# Patient Record
Sex: Male | Born: 1995 | Race: White | Hispanic: No | Marital: Single | Smoking: Current every day smoker
Health system: Southern US, Community
[De-identification: ages and names within clinical notes are randomized; demographics above are authoritative.]

---
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
## 2017-02-20 ENCOUNTER — Other Ambulatory Visit
Admission: RE | Admit: 2017-02-20 | Discharge: 2017-02-20 | Disposition: A | Discharge status: Home / Self Care | Attending: Family Medicine | Admitting: Family Medicine
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
## 2017-10-25 ENCOUNTER — Encounter: Payer: Self-pay | Admitting: Emergency Medicine

## 2017-10-25 ENCOUNTER — Emergency Department
Admission: EM | Admit: 2017-10-25 | Discharge: 2017-10-25 | Disposition: A | Discharge status: Home / Self Care | Attending: Emergency Medicine | Admitting: Emergency Medicine

## 2017-10-25 ENCOUNTER — Other Ambulatory Visit: Payer: Self-pay

## 2017-10-25 DIAGNOSIS — Y999 Unspecified external cause status: Secondary | ICD-10-CM | POA: Insufficient documentation

## 2017-10-25 DIAGNOSIS — Y929 Unspecified place or not applicable: Secondary | ICD-10-CM | POA: Insufficient documentation

## 2017-10-25 DIAGNOSIS — M546 Pain in thoracic spine: Secondary | ICD-10-CM

## 2017-10-25 DIAGNOSIS — W19XXXA Unspecified fall, initial encounter: Secondary | ICD-10-CM | POA: Insufficient documentation

## 2017-10-25 DIAGNOSIS — S2020XA Contusion of thorax, unspecified, initial encounter: Secondary | ICD-10-CM

## 2017-10-25 DIAGNOSIS — F17210 Nicotine dependence, cigarettes, uncomplicated: Secondary | ICD-10-CM | POA: Insufficient documentation

## 2017-10-25 DIAGNOSIS — S20229A Contusion of unspecified back wall of thorax, initial encounter: Secondary | ICD-10-CM | POA: Insufficient documentation

## 2017-10-25 DIAGNOSIS — Y939 Activity, unspecified: Secondary | ICD-10-CM | POA: Insufficient documentation

## 2017-10-25 NOTE — ED Triage Notes (Signed)
FIRST NURSE NOTE-fell and hit back on futon. Ambulatory. NAD
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
## 2017-10-25 NOTE — Discharge Instructions (Addendum)
Follow-up with your primary care provider if any continued problems.  You may take Tylenol or ibuprofen as needed for pain.
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
## 2017-10-26 ENCOUNTER — Other Ambulatory Visit: Payer: Self-pay

## 2017-10-26 ENCOUNTER — Encounter: Payer: Self-pay | Admitting: Emergency Medicine

## 2017-10-26 DIAGNOSIS — Z5321 Procedure and treatment not carried out due to patient leaving prior to being seen by health care provider: Secondary | ICD-10-CM | POA: Insufficient documentation

## 2017-10-26 DIAGNOSIS — M545 Low back pain: Secondary | ICD-10-CM | POA: Insufficient documentation
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
## 2017-10-27 ENCOUNTER — Other Ambulatory Visit: Payer: Self-pay

## 2017-10-27 ENCOUNTER — Emergency Department
Admission: EM | Admit: 2017-10-27 | Discharge: 2017-10-27 | Disposition: A | Discharge status: Home / Self Care | Attending: Emergency Medicine | Admitting: Emergency Medicine

## 2017-10-27 DIAGNOSIS — M545 Low back pain, unspecified: Secondary | ICD-10-CM

## 2017-10-27 DIAGNOSIS — F17210 Nicotine dependence, cigarettes, uncomplicated: Secondary | ICD-10-CM | POA: Insufficient documentation

## 2017-10-27 DIAGNOSIS — M549 Dorsalgia, unspecified: Secondary | ICD-10-CM | POA: Insufficient documentation

## 2017-10-27 MED ORDER — KETOROLAC TROMETHAMINE 10 MG PO TABS: 10.0000 mg | ORAL_TABLET | Freq: Four times a day (QID) | ORAL | 0 refills | Status: DC | PRN

## 2017-10-27 MED ORDER — LIDOCAINE 5 % EX PTCH
1.0000 | MEDICATED_PATCH | CUTANEOUS | Status: DC
  Administered 2017-10-27: 1 via TRANSDERMAL
  Filled 2017-10-27: qty 1

## 2017-10-27 MED ORDER — KETOROLAC TROMETHAMINE 30 MG/ML IJ SOLN
30.0000 mg | Freq: Once | INTRAMUSCULAR | Status: AC
  Administered 2017-10-27: 30 mg via INTRAMUSCULAR
  Filled 2017-10-27: qty 1

## 2017-10-27 MED ORDER — CYCLOBENZAPRINE HCL 5 MG PO TABS: ORAL_TABLET | ORAL | 0 refills | Status: DC

## 2017-10-27 MED ORDER — LIDOCAINE 5 % EX PTCH: 1.0000 | MEDICATED_PATCH | CUTANEOUS | 0 refills | Status: DC

## 2017-10-27 NOTE — ED Notes (Signed)
Pt has lower back pain.  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  Pt states he took a muscle relaxer today without relief.

## 2017-10-27 NOTE — ED Triage Notes (Signed)
Pt arrives to ED via POV from home with c/o back pain. Pt reports being seen here for same "a couple times" for same and reports not better. Pt is A&O, in NAD; RR even, regular and unlabored. Pt is ambulatory with difficulty.

## 2017-10-27 NOTE — ED Provider Notes (Signed)
[facility] Emergency Department Provider Note  ____________________________________________  Time seen: Approximately 8:09 PM  I have reviewed the triage vital signs and the nursing notes.   HISTORY  Chief Complaint Back Pain    HPI Rodney Morgan. is a 22 y.o. male that presents to the emergency department for evaluation of back pain for 4 days. On Saturday, he fell between the cushions of the futon. Pain is constant. Pain eased off somewhat but then worsened. He took a goody powder and muscle relaxer a couple of hours ago. He does not think that anything is broken. He is concerned that pain worsens at work. No SOB, CP, nausea, vomiting.   History reviewed. No pertinent past medical history.  Patient Active Problem List   Diagnosis Date Noted  . Metacarpal bone fracture 01/23/2014    Past Surgical History:  Procedure Laterality Date  . OPEN REDUCTION INTERNAL FIXATION (ORIF) METACARPAL Right 01/25/2014   Procedure: OPEN REDUCTION INTERNAL FIXATION (ORIF) FIFTH METACARPAL;  Surgeon: Vickki Hearing, MD;  Location: [facility];  Service: Orthopedics;  Laterality: Right;  right fifth metacarpal    Prior to Admission medications   Medication Sig Start Date End Date Taking? Authorizing Provider  cyclobenzaprine (FLEXERIL) 5 MG tablet Take 1-2 tablets 3 times daily as needed 10/27/17   Enid Derry, PA-C  ketorolac (TORADOL) 10 MG tablet Take 1 tablet (10 mg total) by mouth every 6 (six) hours as needed. 10/27/17   Enid Derry, PA-C  lidocaine (LIDODERM) 5 % Place 1 patch onto the skin daily. Remove & Discard patch within 12 hours or as directed by MD 10/27/17   Enid Derry, PA-C    Allergies Patient has no known allergies.  No family history on file.  Social History Social History   Tobacco Use  . Smoking status: Current Every Day Smoker    Packs/day: 1.00    Types: Cigarettes  . Smokeless tobacco: Never Used  Substance Use Topics  .  Alcohol use: No  . Drug use: Yes    Types: Marijuana    Comment: last use x1 month ago.     Review of Systems  Cardiovascular: No chest pain. Respiratory: No SOB. Gastrointestinal: No abdominal pain.  No nausea, no vomiting.  Musculoskeletal: Negative for neck pain. Positive for back pain.  Skin: Negative for rash, abrasions, lacerations, ecchymosis. Neurological: Negative for headaches, numbness or tingling   ____________________________________________   PHYSICAL EXAM:  VITAL SIGNS: ED Triage Vitals  Enc Vitals Group     BP 10/27/17 1915 116/75     Pulse Rate 10/27/17 1915 87     Resp 10/27/17 1915 17     Temp 10/27/17 1915 98.5 F (36.9 C)     Temp Source 10/27/17 1915 Oral     SpO2 10/27/17 1915 100 %     Weight 10/27/17 1914 150 lb (68 kg)     Height 10/27/17 1914  (1.727 m)     Head Circumference --      Peak Flow --      Pain Score 10/27/17 1913 8     Pain Loc --      Pain Edu? --      Excl. in GC? --      Constitutional: Alert and oriented. Well appearing and in no acute distress. Eyes: Conjunctivae are normal. PERRL. EOMI. Head: Atraumatic. ENT:      Ears:      Nose: No congestion/rhinnorhea.      Mouth/Throat: Mucous  membranes are moist.  Neck: No stridor.   Cardiovascular: Normal rate, regular rhythm.  Good peripheral circulation. Respiratory: Normal respiratory effort without tachypnea or retractions. Lungs CTAB. Good air entry to the bases with no decreased or absent breath sounds. Gastrointestinal: Bowel sounds 4 quadrants. Soft and nontender to palpation. No guarding or rigidity. No palpable masses. No distention. Musculoskeletal: Full range of motion to all extremities. No gross deformities appreciated. Tenderness to palpation over inferior thoracic spine. Full ROM of back. Strength equal in lower extremities bilaterally. Normal gait. Neurologic:  Normal speech and language. No gross focal neurologic deficits are appreciated.  Skin:  Skin  is warm, dry and intact. No rash noted.   ____________________________________________   LABS (all labs ordered are listed, but only abnormal results are displayed)  Labs Reviewed - No data to display ____________________________________________  EKG   ____________________________________________  RADIOLOGY   No results found.  ____________________________________________    PROCEDURES  Procedure(s) performed:    Procedures    Medications  lidocaine (LIDODERM) 5 % 1 patch (1 patch Transdermal Patch Applied 10/27/17 2139)  ketorolac (TORADOL) 30 MG/ML injection 30 mg (30 mg Intramuscular Given 10/27/17 2139)     ____________________________________________   INITIAL IMPRESSION / ASSESSMENT AND PLAN / ED COURSE  Pertinent labs & imaging results that were available during my care of the patient were reviewed by me and considered in my medical decision making (see chart for details).  Review of the [place] CSRS was performed in accordance of the [facility] prior to dispensing any controlled drugs.     Patient presented to the emergency department for evaluation of back pain after injury for four days. Patient declines imaging. IM toradol was given. Patient will be discharged home with prescriptions for toradol, flexeril, lidoderm. Patient is to follow up with PCP as directed. Patient is given ED precautions to return to the ED for any worsening or new symptoms.     ____________________________________________  FINAL CLINICAL IMPRESSION(S) / ED DIAGNOSES  Final diagnoses:  Acute midline low back pain without sciatica      NEW MEDICATIONS STARTED DURING THIS VISIT:  ED Discharge Orders        Ordered    ketorolac (TORADOL) 10 MG tablet  Every 6 hours PRN     10/27/17 2118    cyclobenzaprine (FLEXERIL) 5 MG tablet     10/27/17 2118    lidocaine (LIDODERM) 5 %  Every 24 hours     10/27/17 2118          This chart was dictated using voice recognition  software/Dragon. Despite best efforts to proofread, errors can occur which can change the meaning. Any change was purely unintentional.    Enid Derry, PA-C 10/27/17 2358    Loleta Rose, MD 10/28/17 [phone]

## 2018-03-20 ENCOUNTER — Emergency Department: Payer: Self-pay

## 2018-03-20 ENCOUNTER — Other Ambulatory Visit: Payer: Self-pay

## 2018-03-20 ENCOUNTER — Emergency Department
Admission: EM | Admit: 2018-03-20 | Discharge: 2018-03-20 | Disposition: A | Discharge status: Home / Self Care | Payer: Self-pay | Attending: Emergency Medicine | Admitting: Emergency Medicine

## 2018-03-20 DIAGNOSIS — Z79899 Other long term (current) drug therapy: Secondary | ICD-10-CM | POA: Insufficient documentation

## 2018-03-20 DIAGNOSIS — F17210 Nicotine dependence, cigarettes, uncomplicated: Secondary | ICD-10-CM | POA: Insufficient documentation

## 2018-03-20 DIAGNOSIS — R0789 Other chest pain: Secondary | ICD-10-CM | POA: Insufficient documentation

## 2018-03-20 LAB — TROPONIN I: Troponin I: <0.03 ng/mL (ref <0.03)

## 2018-03-20 LAB — BASIC METABOLIC PANEL
Anion gap: 8 (ref 5–15)
BUN: 13 mg/dL (ref 6–20)
CHLORIDE: 105 mmol/L (ref 98–111)
CO2: 26 mmol/L (ref 22–32)
Calcium: 9.1 mg/dL (ref 8.9–10.3)
Creatinine, Ser: 0.92 mg/dL (ref 0.61–1.24)
GFR calc Af Amer: >60 mL/min (ref >60)
GFR calc non Af Amer: >60 mL/min (ref >60)
Glucose, Bld: 84 mg/dL (ref 70–99)
POTASSIUM: 3.8 mmol/L (ref 3.5–5.1)
Sodium: 139 mmol/L (ref 135–145)

## 2018-03-20 LAB — CBC
HEMATOCRIT: 44.6 % (ref 39.0–52.0)
Hemoglobin: 14.3 g/dL (ref 13.0–17.0)
MCH: 29.9 pg (ref 26.0–34.0)
MCHC: 32.1 g/dL (ref 30.0–36.0)
MCV: 93.1 fL (ref 80.0–100.0)
Platelets: 205 10*3/uL (ref 150–400)
RBC: 4.79 MIL/uL (ref 4.22–5.81)
RDW: 12.9 % (ref 11.5–15.5)
WBC: 10.4 10*3/uL (ref 4.0–10.5)
nRBC: 0 % (ref 0.0–0.2)

## 2018-03-20 LAB — FIBRIN DERIVATIVES D-DIMER (ARMC ONLY): FIBRIN DERIVATIVES D-DIMER (ARMC): 127.17 ng{FEU}/mL (ref 0.00–499.00)

## 2018-03-20 MED ORDER — NAPROXEN 500 MG PO TABS: 500.0000 mg | ORAL_TABLET | Freq: Two times a day (BID) | ORAL | 2 refills | Status: DC

## 2018-03-20 NOTE — ED Provider Notes (Signed)
[facility] Emergency Department Provider Note   ____________________________________________    I have reviewed the triage vital signs and the nursing notes.   HISTORY  Chief Complaint Shortness of Breath     HPI Rodney Morgan. is a 22 y.o. male who presents with complaints of bilateral chest pain for many months.  Patient states that at work when he takes a deep breath he often feels pain in his lateral chest wall bilaterally.  Currently feels well and has no discomfort.  Denies central chest pain.  No fevers or chills.  No recent travel.  No calf pain or swelling.  Also complains of occasional sore throat.  This is been ongoing for nearly a year.  History reviewed. No pertinent past medical history.  Patient Active Problem List   Diagnosis Date Noted  . Metacarpal bone fracture 01/23/2014    Past Surgical History:  Procedure Laterality Date  . OPEN REDUCTION INTERNAL FIXATION (ORIF) METACARPAL Right 01/25/2014   Procedure: OPEN REDUCTION INTERNAL FIXATION (ORIF) FIFTH METACARPAL;  Surgeon: Vickki Hearing, MD;  Location: [facility];  Service: Orthopedics;  Laterality: Right;  right fifth metacarpal    Prior to Admission medications   Medication Sig Start Date End Date Taking? Authorizing Provider  cyclobenzaprine (FLEXERIL) 5 MG tablet Take 1-2 tablets 3 times daily as needed 10/27/17   Enid Derry, PA-C  ketorolac (TORADOL) 10 MG tablet Take 1 tablet (10 mg total) by mouth every 6 (six) hours as needed. 10/27/17   Enid Derry, PA-C  lidocaine (LIDODERM) 5 % Place 1 patch onto the skin daily. Remove & Discard patch within 12 hours or as directed by MD 10/27/17   Enid Derry, PA-C  naproxen (NAPROSYN) 500 MG tablet Take 1 tablet (500 mg total) by mouth 2 (two) times daily with a meal. 03/20/18   Jene Every, MD     Allergies Patient has no known allergies.  History reviewed. No pertinent family history.  Social History Social  History   Tobacco Use  . Smoking status: Current Every Day Smoker    Packs/day: 1.00    Types: Cigarettes  . Smokeless tobacco: Never Used  Substance Use Topics  . Alcohol use: No  . Drug use: Yes    Types: Marijuana    Comment: last use x1 month ago.    Review of Systems  Constitutional: No fever/chills Eyes: No visual changes.  ENT: As above Cardiovascular: As above Respiratory: No difficulty breathing Gastrointestinal: No abdominal pain.   Genitourinary: Negative for dysuria. Musculoskeletal: Negative for back pain. Skin: Negative for rash. Neurological: Negative for headaches   ____________________________________________   PHYSICAL EXAM:  VITAL SIGNS: ED Triage Vitals  Enc Vitals Group     BP 03/20/18 1526 123/64     Pulse Rate 03/20/18 1526 78     Resp 03/20/18 1526 16     Temp 03/20/18 1526 97.9 F (36.6 C)     Temp Source 03/20/18 1526 Oral     SpO2 03/20/18 1526 100 %     Weight 03/20/18 1525 65.8 kg (145 lb)     Height 03/20/18 1525 1.727 m (5\' 8" )     Head Circumference --      Peak Flow --      Pain Score 03/20/18 1525 7     Pain Loc --      Pain Edu? --      Excl. in GC? --     Constitutional: Alert and oriented.  Eyes: Conjunctivae are normal.   Nose: No congestion/rhinnorhea.  Cardiovascular: Normal rate, regular rhythm. Grossly normal heart sounds.  Good peripheral circulation.  No chest wall tenderness to palpation Respiratory: Normal respiratory effort.  No retractions. Lungs CTAB. Gastrointestinal: Soft and nontender. No distention.  No CVA tenderness.  Musculoskeletal: No lower extremity tenderness nor edema.  Warm and well perfused Neurologic:  Normal speech and language. No gross focal neurologic deficits are appreciated.  Skin:  Skin is warm, dry and intact. No rash noted. Psychiatric: Mood and affect are normal. Speech and behavior are normal.  ____________________________________________   LABS (all labs ordered are  listed, but only abnormal results are displayed)  Labs Reviewed  BASIC METABOLIC PANEL  CBC  TROPONIN I  FIBRIN DERIVATIVES D-DIMER ([facility] ONLY)   ____________________________________________  EKG  ED ECG REPORT I, Jene Every, the attending physician, personally viewed and interpreted this ECG.  Date: 03/20/2018  Rhythm: normal sinus rhythm QRS Axis: normal Intervals: normal ST/T Wave abnormalities: normal Narrative Interpretation: no evidence of acute ischemia  ____________________________________________  RADIOLOGY  Chest x-ray normal ____________________________________________   PROCEDURES  Procedure(s) performed: No  Procedures   Critical Care performed: No ____________________________________________   INITIAL IMPRESSION / ASSESSMENT AND PLAN / ED COURSE  Pertinent labs & imaging results that were available during my care of the patient were reviewed by me and considered in my medical decision making (see chart for details).  Patient well-appearing in no acute distress.  Exam is overall reassuring, lab work is benign, EKG is normal.  Chest x-ray is clear.  Given pleuritic nature of chest pain will send d-dimer although I find the patient to be extraordinarily low risk for PE.  Possibly muscular skeletal pain.  D-dimer negative.  Will treat with Naprosyn, have the patient follow-up with PCP,    ____________________________________________   FINAL CLINICAL IMPRESSION(S) / ED DIAGNOSES  Final diagnoses:  Chest wall pain        Note:  This document was prepared using Dragon voice recognition software and may include unintentional dictation errors.    Jene Every, MD 03/20/18 [phone]

## 2018-03-20 NOTE — ED Triage Notes (Signed)
Pt states "it hurts to breath" x 1 month. States "when I'm at work I get sharp pains in my chest" x 2 weeks. Also c/o throat pain.   A&Ox4, ambulatory, no distress noted.

## 2018-03-28 ENCOUNTER — Emergency Department: Payer: Self-pay

## 2018-03-28 ENCOUNTER — Emergency Department
Admission: EM | Admit: 2018-03-28 | Discharge: 2018-03-28 | Disposition: A | Discharge status: Home / Self Care | Payer: Self-pay | Attending: Emergency Medicine | Admitting: Emergency Medicine

## 2018-03-28 ENCOUNTER — Other Ambulatory Visit: Payer: Self-pay

## 2018-03-28 DIAGNOSIS — S3991XA Unspecified injury of abdomen, initial encounter: Secondary | ICD-10-CM

## 2018-03-28 DIAGNOSIS — F17210 Nicotine dependence, cigarettes, uncomplicated: Secondary | ICD-10-CM | POA: Insufficient documentation

## 2018-03-28 DIAGNOSIS — Z79899 Other long term (current) drug therapy: Secondary | ICD-10-CM | POA: Insufficient documentation

## 2018-03-28 DIAGNOSIS — Y990 Civilian activity done for income or pay: Secondary | ICD-10-CM | POA: Insufficient documentation

## 2018-03-28 DIAGNOSIS — S298XXA Other specified injuries of thorax, initial encounter: Secondary | ICD-10-CM

## 2018-03-28 DIAGNOSIS — S20211A Contusion of right front wall of thorax, initial encounter: Secondary | ICD-10-CM | POA: Insufficient documentation

## 2018-03-28 DIAGNOSIS — Y929 Unspecified place or not applicable: Secondary | ICD-10-CM | POA: Insufficient documentation

## 2018-03-28 DIAGNOSIS — Y9389 Activity, other specified: Secondary | ICD-10-CM | POA: Insufficient documentation

## 2018-03-28 DIAGNOSIS — S301XXA Contusion of abdominal wall, initial encounter: Secondary | ICD-10-CM | POA: Insufficient documentation

## 2018-03-28 DIAGNOSIS — W228XXA Striking against or struck by other objects, initial encounter: Secondary | ICD-10-CM | POA: Insufficient documentation

## 2018-03-28 LAB — CBC WITH DIFFERENTIAL/PLATELET
ABS IMMATURE GRANULOCYTES: 0.05 10*3/uL (ref 0.00–0.07)
Basophils Absolute: 0 10*3/uL (ref 0.0–0.1)
Basophils Relative: 0 %
Eosinophils Absolute: 0.3 10*3/uL (ref 0.0–0.5)
Eosinophils Relative: 2 %
HEMATOCRIT: 44.2 % (ref 39.0–52.0)
Hemoglobin: 14.3 g/dL (ref 13.0–17.0)
Immature Granulocytes: 0 %
LYMPHS ABS: 1.9 10*3/uL (ref 0.7–4.0)
Lymphocytes Relative: 16 %
MCH: 30 pg (ref 26.0–34.0)
MCHC: 32.4 g/dL (ref 30.0–36.0)
MCV: 92.9 fL (ref 80.0–100.0)
MONOS PCT: 8 %
Monocytes Absolute: 1 10*3/uL (ref 0.1–1.0)
NEUTROS ABS: 8.6 10*3/uL — AB (ref 1.7–7.7)
NEUTROS PCT: 74 %
PLATELETS: 221 10*3/uL (ref 150–400)
RBC: 4.76 MIL/uL (ref 4.22–5.81)
RDW: 12.7 % (ref 11.5–15.5)
WBC: 11.9 10*3/uL — ABNORMAL HIGH (ref 4.0–10.5)
nRBC: 0 % (ref 0.0–0.2)

## 2018-03-28 LAB — COMPREHENSIVE METABOLIC PANEL
ALBUMIN: 4.7 g/dL (ref 3.5–5.0)
ALT: 19 U/L (ref 0–44)
AST: 26 U/L (ref 15–41)
Alkaline Phosphatase: 63 U/L (ref 38–126)
Anion gap: 10 (ref 5–15)
BUN: 14 mg/dL (ref 6–20)
CHLORIDE: 105 mmol/L (ref 98–111)
CO2: 26 mmol/L (ref 22–32)
Calcium: 9.4 mg/dL (ref 8.9–10.3)
Creatinine, Ser: 0.84 mg/dL (ref 0.61–1.24)
GFR calc Af Amer: >60 mL/min (ref >60)
GFR calc non Af Amer: >60 mL/min (ref >60)
Glucose, Bld: 82 mg/dL (ref 70–99)
POTASSIUM: 3.5 mmol/L (ref 3.5–5.1)
SODIUM: 141 mmol/L (ref 135–145)
Total Bilirubin: 0.6 mg/dL (ref 0.3–1.2)
Total Protein: 8 g/dL (ref 6.5–8.1)

## 2018-03-28 MED ORDER — TRAMADOL HCL 50 MG PO TABS: 50.0000 mg | ORAL_TABLET | Freq: Four times a day (QID) | ORAL | 0 refills | Status: DC | PRN

## 2018-03-28 MED ORDER — IOPAMIDOL (ISOVUE-300) INJECTION 61%
100.0000 mL | Freq: Once | INTRAVENOUS | Status: AC | PRN
  Administered 2018-03-28: 100 mL via INTRAVENOUS
  Filled 2018-03-28: qty 100

## 2018-03-28 MED ORDER — MELOXICAM 15 MG PO TABS: 15.0000 mg | ORAL_TABLET | Freq: Every day | ORAL | 0 refills | Status: DC

## 2018-03-28 MED ORDER — SODIUM CHLORIDE 0.9 % IV BOLUS
1000.0000 mL | Freq: Once | INTRAVENOUS | Status: AC
  Administered 2018-03-28: 1000 mL via INTRAVENOUS

## 2018-03-28 NOTE — ED Notes (Signed)
See triage note   States he had a tire blow  Hit in rib area
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
## 2018-03-28 NOTE — ED Notes (Signed)
Rodney Morgan says we need uds

## 2018-03-28 NOTE — ED Notes (Signed)
Employer left to go back to work.  Gave patient number [phone] to call if he needs a ride home.
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
## 2018-05-26 ENCOUNTER — Other Ambulatory Visit: Payer: Self-pay

## 2018-05-26 DIAGNOSIS — R1030 Lower abdominal pain, unspecified: Secondary | ICD-10-CM | POA: Insufficient documentation

## 2018-05-26 DIAGNOSIS — R110 Nausea: Secondary | ICD-10-CM | POA: Insufficient documentation

## 2018-05-26 DIAGNOSIS — Z5321 Procedure and treatment not carried out due to patient leaving prior to being seen by health care provider: Secondary | ICD-10-CM | POA: Insufficient documentation

## 2018-05-26 LAB — COMPREHENSIVE METABOLIC PANEL
ALBUMIN: 4.3 g/dL (ref 3.5–5.0)
ALK PHOS: 55 U/L (ref 38–126)
ALT: 19 U/L (ref 0–44)
AST: 31 U/L (ref 15–41)
Anion gap: 10 (ref 5–15)
BUN: 18 mg/dL (ref 6–20)
CALCIUM: 9.4 mg/dL (ref 8.9–10.3)
CO2: 24 mmol/L (ref 22–32)
CREATININE: 1.05 mg/dL (ref 0.61–1.24)
Chloride: 106 mmol/L (ref 98–111)
GFR calc Af Amer: >60 mL/min (ref >60)
GFR calc non Af Amer: >60 mL/min (ref >60)
GLUCOSE: 147 mg/dL — AB (ref 70–99)
Potassium: 3.6 mmol/L (ref 3.5–5.1)
SODIUM: 140 mmol/L (ref 135–145)
Total Bilirubin: 0.6 mg/dL (ref 0.3–1.2)
Total Protein: 7.2 g/dL (ref 6.5–8.1)

## 2018-05-26 LAB — URINALYSIS, COMPLETE (UACMP) WITH MICROSCOPIC
BILIRUBIN URINE: NEGATIVE
Bacteria, UA: NONE SEEN
Glucose, UA: NEGATIVE mg/dL
Ketones, ur: 5 mg/dL — AB
Leukocytes, UA: NEGATIVE
Nitrite: NEGATIVE
PROTEIN: NEGATIVE mg/dL
SPECIFIC GRAVITY, URINE: 1.026 (ref 1.005–1.030)
pH: 5 (ref 5.0–8.0)

## 2018-05-26 LAB — CBC
HCT: 38.9 % — ABNORMAL LOW (ref 39.0–52.0)
Hemoglobin: 13 g/dL (ref 13.0–17.0)
MCH: 29.5 pg (ref 26.0–34.0)
MCHC: 33.4 g/dL (ref 30.0–36.0)
MCV: 88.4 fL (ref 80.0–100.0)
PLATELETS: 213 10*3/uL (ref 150–400)
RBC: 4.4 MIL/uL (ref 4.22–5.81)
RDW: 12.8 % (ref 11.5–15.5)
WBC: 10 10*3/uL (ref 4.0–10.5)
nRBC: 0 % (ref 0.0–0.2)

## 2018-05-26 LAB — LIPASE, BLOOD: Lipase: 28 U/L (ref 11–51)
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
## 2018-05-27 ENCOUNTER — Emergency Department
Admission: EM | Admit: 2018-05-27 | Discharge: 2018-05-27 | Discharge status: Against Medical Advice | Payer: Self-pay | Attending: Emergency Medicine | Admitting: Emergency Medicine

## 2018-05-27 NOTE — ED Notes (Signed)
No answer when called several times from lobby 

## 2018-05-28 ENCOUNTER — Other Ambulatory Visit: Payer: Self-pay

## 2018-05-28 ENCOUNTER — Emergency Department: Payer: Self-pay

## 2018-05-28 ENCOUNTER — Emergency Department
Admission: EM | Admit: 2018-05-28 | Discharge: 2018-05-28 | Disposition: A | Discharge status: Home / Self Care | Payer: Self-pay | Attending: Emergency Medicine | Admitting: Emergency Medicine

## 2018-05-28 ENCOUNTER — Encounter: Payer: Self-pay | Admitting: Radiology

## 2018-05-28 DIAGNOSIS — Z79899 Other long term (current) drug therapy: Secondary | ICD-10-CM | POA: Insufficient documentation

## 2018-05-28 DIAGNOSIS — F17210 Nicotine dependence, cigarettes, uncomplicated: Secondary | ICD-10-CM | POA: Insufficient documentation

## 2018-05-28 DIAGNOSIS — N200 Calculus of kidney: Secondary | ICD-10-CM | POA: Insufficient documentation

## 2018-05-28 LAB — LIPASE, BLOOD: Lipase: 22 U/L (ref 11–51)

## 2018-05-28 LAB — URINALYSIS, COMPLETE (UACMP) WITH MICROSCOPIC
Bacteria, UA: NONE SEEN
Bilirubin Urine: NEGATIVE
Glucose, UA: NEGATIVE mg/dL
KETONES UR: 80 mg/dL — AB
Nitrite: NEGATIVE
PH: 6 (ref 5.0–8.0)
PROTEIN: 100 mg/dL — AB
Specific Gravity, Urine: 1.028 (ref 1.005–1.030)

## 2018-05-28 LAB — COMPREHENSIVE METABOLIC PANEL
ALBUMIN: 5 g/dL (ref 3.5–5.0)
ALK PHOS: 60 U/L (ref 38–126)
ALT: 22 U/L (ref 0–44)
ANION GAP: 11 (ref 5–15)
AST: 33 U/L (ref 15–41)
BUN: 17 mg/dL (ref 6–20)
CALCIUM: 9.5 mg/dL (ref 8.9–10.3)
CO2: 22 mmol/L (ref 22–32)
CREATININE: 1.01 mg/dL (ref 0.61–1.24)
Chloride: 105 mmol/L (ref 98–111)
GFR calc Af Amer: >60 mL/min (ref >60)
GFR calc non Af Amer: >60 mL/min (ref >60)
GLUCOSE: 102 mg/dL — AB (ref 70–99)
Potassium: 3.4 mmol/L — ABNORMAL LOW (ref 3.5–5.1)
SODIUM: 138 mmol/L (ref 135–145)
Total Bilirubin: 1.1 mg/dL (ref 0.3–1.2)
Total Protein: 7.9 g/dL (ref 6.5–8.1)

## 2018-05-28 LAB — CBC
HCT: 42.7 % (ref 39.0–52.0)
Hemoglobin: 14.4 g/dL (ref 13.0–17.0)
MCH: 29.7 pg (ref 26.0–34.0)
MCHC: 33.7 g/dL (ref 30.0–36.0)
MCV: 88 fL (ref 80.0–100.0)
PLATELETS: 226 10*3/uL (ref 150–400)
RBC: 4.85 MIL/uL (ref 4.22–5.81)
RDW: 12.9 % (ref 11.5–15.5)
WBC: 14.7 10*3/uL — ABNORMAL HIGH (ref 4.0–10.5)
nRBC: 0 % (ref 0.0–0.2)

## 2018-05-28 MED ORDER — IBUPROFEN 600 MG PO TABS: 600.0000 mg | ORAL_TABLET | Freq: Four times a day (QID) | ORAL | 0 refills | Status: DC | PRN

## 2018-05-28 MED ORDER — ONDANSETRON 4 MG PO TBDP: 4.0000 mg | ORAL_TABLET | Freq: Three times a day (TID) | ORAL | 0 refills | Status: DC | PRN

## 2018-05-28 MED ORDER — IOPAMIDOL (ISOVUE-300) INJECTION 61%
100.0000 mL | Freq: Once | INTRAVENOUS | Status: AC | PRN
  Administered 2018-05-28: 100 mL via INTRAVENOUS

## 2018-05-28 MED ORDER — SODIUM CHLORIDE 0.9 % IV BOLUS
1000.0000 mL | Freq: Once | INTRAVENOUS | Status: AC
  Administered 2018-05-28: 1000 mL via INTRAVENOUS

## 2018-05-28 MED ORDER — ONDANSETRON HCL 4 MG/2ML IJ SOLN
4.0000 mg | Freq: Once | INTRAMUSCULAR | Status: AC
  Administered 2018-05-28: 4 mg via INTRAVENOUS
  Filled 2018-05-28: qty 2

## 2018-05-28 MED ORDER — FENTANYL CITRATE (PF) 100 MCG/2ML IJ SOLN
25.0000 ug | Freq: Once | INTRAMUSCULAR | Status: AC
  Administered 2018-05-28: 25 ug via INTRAVENOUS
  Filled 2018-05-28: qty 2

## 2018-05-28 MED ORDER — TAMSULOSIN HCL 0.4 MG PO CAPS: 0.4000 mg | ORAL_CAPSULE | Freq: Every day | ORAL | 0 refills | Status: AC

## 2018-05-28 MED ORDER — TAMSULOSIN HCL 0.4 MG PO CAPS
0.4000 mg | ORAL_CAPSULE | Freq: Once | ORAL | Status: AC
  Administered 2018-05-28: 0.4 mg via ORAL
  Filled 2018-05-28: qty 1

## 2018-05-28 MED ORDER — KETOROLAC TROMETHAMINE 30 MG/ML IJ SOLN
15.0000 mg | Freq: Once | INTRAMUSCULAR | Status: AC
  Administered 2018-05-28: 15 mg via INTRAVENOUS
  Filled 2018-05-28: qty 1

## 2018-05-28 MED ORDER — FENTANYL CITRATE (PF) 100 MCG/2ML IJ SOLN: 50.0000 ug | Freq: Once | INTRAMUSCULAR | Status: DC

## 2018-05-28 NOTE — ED Provider Notes (Signed)
[facility] Emergency Department Provider Note  ____________________________________________  Time seen: Approximately 4:28 PM  I have reviewed the triage vital signs and the nursing notes.   HISTORY  Chief Complaint Abdominal Pain   HPI Rodney NovelRyan S Eanes Jr. is a 22 y.o. male who presents for evaluation of right lower quadrant abdominal pain.  Patient reports a sharp pain located in his right lower quadrant radiating to his testicle.  The pain initially was intermittent however since last night has become constant.  He also endorses that since last night he has had nausea, several episodes of nonbloody nonbilious emesis, and decreased appetite.  Has not been able to eat anything today due to anorexia.  No fever or chills.  No dysuria, testicular swelling, or penile discharge.  He also reports 2 days of constipation which is atypical for him.   History reviewed. No pertinent past medical history.  Patient Active Problem List   Diagnosis Date Noted  . Metacarpal bone fracture 01/23/2014    Past Surgical History:  Procedure Laterality Date  . OPEN REDUCTION INTERNAL FIXATION (ORIF) METACARPAL Right 01/25/2014   Procedure: OPEN REDUCTION INTERNAL FIXATION (ORIF) FIFTH METACARPAL;  Surgeon: Vickki HearingStanley E Harrison, MD;  Location: [facility];  Service: Orthopedics;  Laterality: Right;  right fifth metacarpal    Prior to Admission medications   Medication Sig Start Date End Date Taking? Authorizing Provider  cyclobenzaprine (FLEXERIL) 5 MG tablet Take 1-2 tablets 3 times daily as needed 10/27/17   Enid DerryWagner, Ashley, PA-C  ibuprofen (ADVIL,MOTRIN) 600 MG tablet Take 1 tablet (600 mg total) by mouth every 6 (six) hours as needed. 05/28/18   Nita SickleVeronese, [place], MD  ketorolac (TORADOL) 10 MG tablet Take 1 tablet (10 mg total) by mouth every 6 (six) hours as needed. 10/27/17   Enid DerryWagner, Ashley, PA-C  lidocaine (LIDODERM) 5 % Place 1 patch onto the skin daily. Remove & Discard patch  within 12 hours or as directed by MD 10/27/17   Enid DerryWagner, Ashley, PA-C  meloxicam (MOBIC) 15 MG tablet Take 1 tablet (15 mg total) by mouth daily. 03/28/18   Cuthriell, Delorise RoyalsJonathan D, PA-C  naproxen (NAPROSYN) 500 MG tablet Take 1 tablet (500 mg total) by mouth 2 (two) times daily with a meal. 03/20/18   Jene EveryKinner, Robert, MD  ondansetron (ZOFRAN ODT) 4 MG disintegrating tablet Take 1 tablet (4 mg total) by mouth every 8 (eight) hours as needed. 05/28/18   Nita SickleVeronese, [place], MD  tamsulosin (FLOMAX) 0.4 MG CAPS capsule Take 1 capsule (0.4 mg total) by mouth daily for 7 days. 05/28/18 06/04/18  Nita SickleVeronese, Mercedes, MD  traMADol (ULTRAM) 50 MG tablet Take 1 tablet (50 mg total) by mouth every 6 (six) hours as needed. 03/28/18   Cuthriell, Delorise RoyalsJonathan D, PA-C    Allergies Patient has no known allergies.  No family history on file.  Social History Social History   Tobacco Use  . Smoking status: Current Every Day Smoker    Packs/day: 1.00    Types: Cigarettes  . Smokeless tobacco: Never Used  Substance Use Topics  . Alcohol use: No  . Drug use: Not Currently    Comment: last use x1 month ago.    Review of Systems  Constitutional: Negative for fever. Eyes: Negative for visual changes. ENT: Negative for sore throat. Neck: No neck pain  Cardiovascular: Negative for chest pain. Respiratory: Negative for shortness of breath. Gastrointestinal: + RLQ abdominal pain, nausea, vomiting and constipation. Genitourinary: Negative for dysuria. Musculoskeletal: Negative for back pain. Skin:  Negative for rash. Neurological: Negative for headaches, weakness or numbness. Psych: No SI or HI  ____________________________________________   PHYSICAL EXAM:  VITAL SIGNS: ED Triage Vitals  Enc Vitals Group     BP 05/28/18 1617 (!) 140/93     Pulse Rate 05/28/18 1617 77     Resp 05/28/18 1617 18     Temp 05/28/18 1617 98.6 F (37 C)     Temp Source 05/28/18 1617 Oral     SpO2 05/28/18 1617 100 %      Weight 05/28/18 1618 150 lb (68 kg)     Height 05/28/18 1618 5\' 8"  (1.727 m)     Head Circumference --      Peak Flow --      Pain Score 05/28/18 1618 10     Pain Loc --      Pain Edu? --      Excl. in [place]? --     Constitutional: Alert and oriented. Well appearing and in no apparent distress. HEENT:      Head: Normocephalic and atraumatic.         Eyes: Conjunctivae are normal. Sclera is non-icteric.       Mouth/Throat: Mucous membranes are moist.       Neck: Supple with no signs of meningismus. Cardiovascular: Regular rate and rhythm. No murmurs, gallops, or rubs. 2+ symmetrical distal pulses are present in all extremities. No JVD. Respiratory: Normal respiratory effort. Lungs are clear to auscultation bilaterally. No wheezes, crackles, or rhonchi.  Gastrointestinal: Soft, ttp over the RLQ, and non distended with positive bowel sounds. No rebound or guarding. Genitourinary: No CVA tenderness. Bilateral testicles are descended with no tenderness to palpation, bilateral positive cremasteric reflexes are present, no swelling or erythema of the scrotum. No evidence of inguinal hernia. Musculoskeletal: Nontender with normal range of motion in all extremities. No edema, cyanosis, or erythema of extremities. Neurologic: Normal speech and language. Face is symmetric. Moving all extremities. No gross focal neurologic deficits are appreciated. Skin: Skin is warm, dry and intact. No rash noted. Psychiatric: Mood and affect are normal. Speech and behavior are normal.  ____________________________________________   LABS (all labs ordered are listed, but only abnormal results are displayed)  Labs Reviewed  COMPREHENSIVE METABOLIC PANEL - Abnormal; Notable for the following components:      Result Value   Potassium 3.4 (*)    Glucose, Bld 102 (*)    All other components within normal limits  CBC - Abnormal; Notable for the following components:   WBC 14.7 (*)    All other components within  normal limits  URINALYSIS, COMPLETE (UACMP) WITH MICROSCOPIC - Abnormal; Notable for the following components:   Color, Urine YELLOW (*)    APPearance CLEAR (*)    Hgb urine dipstick LARGE (*)    Ketones, ur 80 (*)    Protein, ur 100 (*)    Leukocytes, UA TRACE (*)    RBC / HPF >50 (*)    All other components within normal limits  URINE CULTURE  LIPASE, BLOOD   ____________________________________________  EKG  none  ____________________________________________  RADIOLOGY  I have personally reviewed the images performed during this visit and I agree with the Radiologist's read.   Interpretation by Radiologist:  Ct Abdomen Pelvis W Contrast  Result Date: 05/28/2018 CLINICAL DATA:  Acute right lower quadrant abdominal pain. EXAM: CT ABDOMEN AND PELVIS WITH CONTRAST TECHNIQUE: Multidetector CT imaging of the abdomen and pelvis was performed using the standard protocol following bolus administration  of intravenous contrast. CONTRAST:  ISOVUE-300 IOPAMIDOL (ISOVUE-300) INJECTION 61% COMPARISON:  CT scan of March 28, 2018. FINDINGS: Lower chest: No acute abnormality. Hepatobiliary: No focal liver abnormality is seen. No gallstones, gallbladder wall thickening, or biliary dilatation. Pancreas: Unremarkable. No pancreatic ductal dilatation or surrounding inflammatory changes. Spleen: Normal in size without focal abnormality. Adrenals/Urinary Tract: Adrenal glands appear normal. Mild right hydroureteronephrosis is noted secondary to 3 mm calculus at the right ureterovesical junction. Left kidney and ureter are unremarkable. Stomach/Bowel: Stomach is within normal limits. Appendix appears normal. No evidence of bowel wall thickening, distention, or inflammatory changes. Vascular/Lymphatic: No significant vascular findings are present. No enlarged abdominal or pelvic lymph nodes. Reproductive: Uterus and bilateral adnexa are unremarkable. Other: No abdominal wall hernia or abnormality. No  abdominopelvic ascites. Musculoskeletal: No acute or significant osseous findings. IMPRESSION: Mild right hydroureteronephrosis secondary to 3 mm calculus at right ureterovesical junction. Electronically Signed   By: Lupita Raider, M.D.   On: 05/28/2018 17:13      ____________________________________________   PROCEDURES  Procedure(s) performed: None Procedures Critical Care performed:  None ____________________________________________   INITIAL IMPRESSION / ASSESSMENT AND PLAN / ED COURSE  22 y.o. male who presents for evaluation of right lower quadrant abdominal pain, nausea, vomiting.  Patient is well-appearing with normal vital signs, he is tender to palpation on the right lower quadrant with no rebound or guarding.  GU exam is normal.  Differential diagnosis including appendicitis versus epiploic appendagitis versus mesenteric adenitis versus urinary tract infection versus kidney stone.  Patient was given fentanyl and Zofran for symptom relief.  Labs and CT pending.  Clinical Course as of May 28 1838  Sat May 28, 2018  1758 CT showing 3 mm right ureteral obstructive stone located at the UVJ.  Patient's pain has resolved at this time. Most likely passed the stone. No evidence of AKI or overlying UTI.  Patient be discharged home on supportive care with follow-up with primary care doctor.   [CV]    Clinical Course User Index [CV] Don Perking [place], MD     As part of my medical decision making, I reviewed the following data within the electronic MEDICAL RECORD NUMBER Nursing notes reviewed and incorporated, Labs reviewed , Old chart reviewed, Radiograph reviewed , Notes from prior ED visits and [place] Controlled Substance Database    Pertinent labs & imaging results that were available during my care of the patient were reviewed by me and considered in my medical decision making (see chart for details).    ____________________________________________   FINAL CLINICAL IMPRESSION(S)  / ED DIAGNOSES  Final diagnoses:  Kidney stone      NEW MEDICATIONS STARTED DURING THIS VISIT:  ED Discharge Orders         Ordered    ibuprofen (ADVIL,MOTRIN) 600 MG tablet  Every 6 hours PRN     05/28/18 1759    tamsulosin (FLOMAX) 0.4 MG CAPS capsule  Daily     05/28/18 1759    ondansetron (ZOFRAN ODT) 4 MG disintegrating tablet  Every 8 hours PRN     05/28/18 1759           Note:  This document was prepared using Dragon voice recognition software and may include unintentional dictation errors.    Nita Sickle, MD 05/28/18 [phone]

## 2018-05-28 NOTE — Discharge Instructions (Addendum)
You have been seen in the Emergency Department (ED)  Today and was diagnosed with kidney stones. While the stone is traveling through the ureter, which is the tube that carries urine from the kidney to the bladder, you will probably feel pain. The pain may be mild or very severe. You may also have some blood in your urine. As soon as the stone reaches the bladder, any intense pain should go away. If a stone is too large to pass on its own, you may need a medical procedure to help you pass the stone.   As we have discussed, please drink plenty of fluids and use a urinary strainer to attempt to capture the stone.  Please make a follow up appointment with Urology in the next week by calling the number below and bring the stone with you.  Take ibuprofen 600mg  every 6 hours for the pain. Please also take your prescribed flomax daily. Check with your doctor if you have a history of gastritis, stomach ulcers, renal failure or impaired kidney function as you may not be able to take ibuprofen/ motrin. Your doctor can give you a different prescription for pain control.  Follow-up with your doctor or return to the ER in 12-24 hours if your pain is not well controlled, if you develop pain or burning with urination, or if you develop a fever. Otherwise follow up in 3-5 days with your doctor.  When should you call for help?  Call your doctor now or seek immediate medical care if:  You cannot keep down fluids.  Your pain gets worse.  You have a fever or chills.  You have new or worse pain in your back just below your rib cage (the flank area).  You have new or more blood in your urine. You have pain or burning with urination You are unable to urinate You have abdominal pain  Watch closely for changes in your health, and be sure to contact your doctor if:  You do not get better as expected  How can you care for yourself at home?  Drink plenty of fluids, enough so that your urine is light yellow or clear like  water. If you have kidney, heart, or liver disease and have to limit fluids, talk with your doctor before you increase the amount of fluids you drink.  Take pain medicines exactly as directed. Call your doctor if you think you are having a problem with your medicine.  If the doctor gave you a prescription medicine for pain, take it as prescribed.  If you are not taking a prescription pain medicine, ask your doctor if you can take an over-the-counter medicine. Read and follow all instructions on the label. Your doctor may ask you to strain your urine so that you can collect your kidney stone when it passes. You can use a kitchen strainer or a tea strainer to catch the stone. Store it in a plastic bag until you see your doctor again.  Preventing future kidney stones  Some changes in your diet may help prevent kidney stones. Depending on the cause of your stones, your doctor may recommend that you:  Drink plenty of fluids, enough so that your urine is light yellow or clear like water. If you have kidney, heart, or liver disease and have to limit fluids, talk with your doctor before you increase the amount of fluids you drink.  Limit coffee, tea, and alcohol. Also avoid grapefruit juice.  Do not take more than the recommended  daily dose of vitamins C and D.  Avoid antacids such as Gaviscon, Maalox, Mylanta, or Tums.  Limit the amount of salt (sodium) in your diet.  Eat a balanced diet that is not too high in protein.  Limit foods that are high in a substance called oxalate, which can cause kidney stones. These foods include dark green vegetables, rhubarb, chocolate, wheat bran, nuts, cranberries, and beans.
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
## 2018-05-30 LAB — URINE CULTURE: Culture: <10000 — AB

## 2019-01-25 ENCOUNTER — Emergency Department
Admission: EM | Admit: 2019-01-25 | Discharge: 2019-01-25 | Disposition: A | Discharge status: Home / Self Care | Payer: Self-pay | Attending: Emergency Medicine | Admitting: Emergency Medicine

## 2019-01-25 ENCOUNTER — Other Ambulatory Visit: Payer: Self-pay

## 2019-01-25 DIAGNOSIS — R6883 Chills (without fever): Secondary | ICD-10-CM | POA: Insufficient documentation

## 2019-01-25 DIAGNOSIS — R51 Headache: Secondary | ICD-10-CM | POA: Insufficient documentation

## 2019-01-25 DIAGNOSIS — Z5321 Procedure and treatment not carried out due to patient leaving prior to being seen by health care provider: Secondary | ICD-10-CM | POA: Insufficient documentation

## 2019-01-25 DIAGNOSIS — R531 Weakness: Secondary | ICD-10-CM | POA: Insufficient documentation

## 2019-01-25 LAB — BASIC METABOLIC PANEL
Anion gap: 5 (ref 5–15)
BUN: 12 mg/dL (ref 6–20)
CO2: 23 mmol/L (ref 22–32)
Calcium: 9.2 mg/dL (ref 8.9–10.3)
Chloride: 110 mmol/L (ref 98–111)
Creatinine, Ser: 0.78 mg/dL (ref 0.61–1.24)
GFR calc Af Amer: >60 mL/min (ref >60)
GFR calc non Af Amer: >60 mL/min (ref >60)
Glucose, Bld: 103 mg/dL — ABNORMAL HIGH (ref 70–99)
Potassium: 4 mmol/L (ref 3.5–5.1)
Sodium: 138 mmol/L (ref 135–145)

## 2019-01-25 LAB — CBC
HCT: 43.8 % (ref 39.0–52.0)
Hemoglobin: 14.8 g/dL (ref 13.0–17.0)
MCH: 30.2 pg (ref 26.0–34.0)
MCHC: 33.8 g/dL (ref 30.0–36.0)
MCV: 89.4 fL (ref 80.0–100.0)
Platelets: 185 10*3/uL (ref 150–400)
RBC: 4.9 MIL/uL (ref 4.22–5.81)
RDW: 13 % (ref 11.5–15.5)
WBC: 7.5 10*3/uL (ref 4.0–10.5)
nRBC: 0 % (ref 0.0–0.2)

## 2019-01-25 MED ORDER — SODIUM CHLORIDE 0.9% FLUSH: 3.0000 mL | Freq: Once | INTRAVENOUS | Status: DC

## 2019-01-25 NOTE — ED Notes (Signed)
Called to be roomed at 1420.  Called to be roomed at 1425.  Called to be roomed at 1430.

## 2019-01-25 NOTE — ED Triage Notes (Signed)
Pt comes via POV from home with headache, weakness, chills, abscess tooth that started last Tuesday.  Pt denies SOB and CP.  Pt states he just doesn't feel better and needs to go back to work.

## 2019-01-26 ENCOUNTER — Telehealth: Payer: Self-pay | Admitting: Emergency Medicine

## 2019-01-26 NOTE — Telephone Encounter (Signed)
Called patient due to lwot to inquire about condition and follow up plans. Says he went to [facility] today

## 2019-02-28 ENCOUNTER — Encounter: Payer: Self-pay | Admitting: Emergency Medicine

## 2019-02-28 ENCOUNTER — Other Ambulatory Visit: Payer: Self-pay

## 2019-02-28 ENCOUNTER — Emergency Department
Admission: EM | Admit: 2019-02-28 | Discharge: 2019-02-28 | Discharge status: Against Medical Advice | Payer: Self-pay | Attending: Emergency Medicine | Admitting: Emergency Medicine

## 2019-02-28 DIAGNOSIS — K029 Dental caries, unspecified: Secondary | ICD-10-CM | POA: Insufficient documentation

## 2019-02-28 DIAGNOSIS — R51 Headache: Secondary | ICD-10-CM | POA: Insufficient documentation

## 2019-02-28 DIAGNOSIS — R5381 Other malaise: Secondary | ICD-10-CM | POA: Insufficient documentation

## 2019-02-28 DIAGNOSIS — F17210 Nicotine dependence, cigarettes, uncomplicated: Secondary | ICD-10-CM | POA: Insufficient documentation

## 2019-02-28 DIAGNOSIS — R519 Headache, unspecified: Secondary | ICD-10-CM

## 2019-02-28 DIAGNOSIS — Z79899 Other long term (current) drug therapy: Secondary | ICD-10-CM | POA: Insufficient documentation

## 2019-02-28 DIAGNOSIS — R52 Pain, unspecified: Secondary | ICD-10-CM

## 2019-02-28 DIAGNOSIS — Z5320 Procedure and treatment not carried out because of patient's decision for unspecified reasons: Secondary | ICD-10-CM | POA: Insufficient documentation

## 2019-02-28 MED ORDER — AMOXICILLIN 500 MG PO CAPS: 500.0000 mg | ORAL_CAPSULE | Freq: Three times a day (TID) | ORAL | 0 refills | Status: DC
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
## 2019-02-28 NOTE — ED Notes (Signed)
Pt reports he is not going to stay for lab work at this time, esignature pad not working in room.  Pt left ED with brother.

## 2019-02-28 NOTE — Discharge Instructions (Addendum)
I recommend that you stay for blood work and testing for your concerns. Your covid results will be ready in a couple of days. Please follow up with dentist. Return to ED for worsening of symptoms.  OPTIONS FOR DENTAL FOLLOW UP CARE  [facility] and [email or web address] [phone])  [facility] [phone])  [place] [phone])  [place] [phone])  [facility] [phone]) This clinic caters to the indigent population and is on a lottery system. Location: [employer] [facility], [place], [place], [place] [facility] Hours: Wednesdays from 6pm - 9pm, patients seen by a lottery system. For dates, call or go to [email or web address] Services: Cleanings, fillings and simple extractions. Payment Options: DENTAL WORK IS FREE OF CHARGE. Bring proof of income or support. Best way to get seen: Arrive at 5:15 pm - this is a lottery, NOT first come/first serve, so arriving earlier will not increase your chances of being seen.     [facility] [phone] Select option 1 for emergencies   Location: [facility], [place], [address], [place] Clinic Hours: No walk-ins accepted - call the day before to schedule an appointment. Check in times are 9:30 am and 1:30 pm. Services: Simple extractions, temporary fillings, pulpectomy/pulp debridement, uncomplicated abscess drainage. Payment Options: PAYMENT IS DUE AT THE TIME OF SERVICE.  Fee is usually $100-200, additional surgical procedures (e.g. abscess drainage) may be extra. Cash, checks, Visa/MasterCard accepted.  Can file Medicaid if patient is covered for dental - patient should call case worker to check. No discount for [facility]  patients. Best way to get seen: MUST call the day before and get onto the schedule. Can usually be seen the next 1-2 days. No walk-ins accepted.     [place] [phone]   Location: [place], [place] [facility] Hours: M, W, Th, F 8am or 1:30pm, Tues 9a or 1:30 - first come/first served. Services: Simple extractions, temporary fillings, uncomplicated abscess drainage.  You do not need to be an [facility] resident. Payment Options: PAYMENT IS DUE AT THE TIME OF SERVICE. Dental insurance, otherwise sliding scale - bring proof of income or support. Depending on income and treatment needed, cost is usually $50-200. Best way to get seen: Arrive early as it is first come/first served.     [facility] [phone]   Location: [place] [facility] Hours: Mon-Thu 8a-5p Services: Most basic dental services including extractions and fillings. Payment Options: PAYMENT IS DUE AT THE TIME OF SERVICE. Sliding scale, up to 50% off - bring proof if income or support. Medicaid with dental option accepted. Best way to get seen: Call to schedule an appointment, can usually be seen within 2 weeks OR they will try to see walk-ins - show up at [place] or 2p (you may have to wait).     [facility] [place] RESIDENTS ONLY   Location: [facility], [place] [address], [place], [place] [address] Clinic Hours: By appointment only. Monday - Thursday 8am-5pm, Friday 8am-12pm Services: Cleanings, fillings, extractions. Payment Options: PAYMENT IS DUE AT THE TIME OF SERVICE. Cash, Visa or MasterCard. Sliding scale - $30 minimum per service. Best way to get seen: Come in to office, complete packet and make an appointment - need proof of income  or support monies for each household member and proof of [facility] residence. Usually takes about a month to  get in.     [facility] [phone]   Location: [address], [place] Clinic Hours: Walk-in Urgent Care Dental Services are offered Monday-Friday mornings only. The numbers of emergencies accepted daily is limited to the number of providers available. Maximum 15 - Mondays, Wednesdays & Thursdays Maximum 10 - Tuesdays & Fridays Services: You do not need to be a [facility] resident to be seen for a dental emergency. Emergencies are defined as pain, swelling, abnormal bleeding, or dental trauma. Walkins will receive x-rays if needed. NOTE: Dental cleaning is not an emergency. Payment Options: PAYMENT IS DUE AT THE TIME OF SERVICE. Minimum co-pay is $40.00 for uninsured patients. Minimum co-pay is $3.00 for Medicaid with dental coverage. Dental Insurance is accepted and must be presented at time of visit. Medicare does not cover dental. Forms of payment: Cash, credit card, checks. Best way to get seen: If not previously registered with the clinic, walk-in dental registration begins at 7:15 am and is on a first come/first serve basis. If previously registered with the clinic, call to make an appointment.     [facility] [place] [employer] ONLY   Location: [address] [place], [place] Clinic Hours: Mon-Thu 10a-2p Services: Extractions only! Payment Options: FREE (donations accepted) - bring proof of income or support Best way to get seen: Call and schedule an appointment OR come at 8am on the 1st Monday of every month (except for holidays) when it is first come/first served.     [facility] [phone]   Location: [place], [place] Clinic Hours: Friday mornings Services, Payment Options, Best way to get seen: Call for info

## 2019-04-10 ENCOUNTER — Other Ambulatory Visit: Payer: Self-pay

## 2019-04-10 ENCOUNTER — Emergency Department
Admission: EM | Admit: 2019-04-10 | Discharge: 2019-04-10 | Disposition: A | Discharge status: Home / Self Care | Payer: Self-pay | Attending: Emergency Medicine | Admitting: Emergency Medicine

## 2019-04-10 DIAGNOSIS — J988 Other specified respiratory disorders: Secondary | ICD-10-CM | POA: Insufficient documentation

## 2019-04-10 DIAGNOSIS — Z20828 Contact with and (suspected) exposure to other viral communicable diseases: Secondary | ICD-10-CM | POA: Insufficient documentation

## 2019-04-10 DIAGNOSIS — B9789 Other viral agents as the cause of diseases classified elsewhere: Secondary | ICD-10-CM

## 2019-04-10 DIAGNOSIS — F17210 Nicotine dependence, cigarettes, uncomplicated: Secondary | ICD-10-CM | POA: Insufficient documentation

## 2019-04-10 LAB — SARS CORONAVIRUS 2 (TAT 6-24 HRS): SARS Coronavirus 2: NEGATIVE

## 2019-04-10 NOTE — ED Triage Notes (Signed)
Pt c/o cough with sore throat and congestion since yesterday.

## 2019-04-10 NOTE — ED Notes (Signed)
See triage note   Presents with cough  Sore throat and congestion  States sxs' started yesterday  Denies any fever  Afebrile on arrival

## 2019-04-10 NOTE — ED Provider Notes (Signed)
[facility] Emergency Department Provider Note  ____________________________________________  Time seen: Approximately 10:38 AM  I have reviewed the triage vital signs and the nursing notes.   HISTORY  Chief Complaint URI    HPI Rodney Morgan. is a 23 y.o. male who presents the emergency department complaining of nasal congestion, sore throat, cough, mild body aches and diarrhea.  Patient has had symptoms x3 days.  He states that he told his boss about his symptoms and he was told he could not come back to work until he was evaluated.  Patient has been taking Tylenol Cold and cough medicine.  No other medication for his complaint.  Patient denies any headache, visual changes, neck pain or stiffness, chest pain, shortness of breath, abdominal pain, nausea or vomiting.         No past medical history on file.  Patient Active Problem List   Diagnosis Date Noted  . Metacarpal bone fracture 01/23/2014    Past Surgical History:  Procedure Laterality Date  . OPEN REDUCTION INTERNAL FIXATION (ORIF) METACARPAL Right 01/25/2014   Procedure: OPEN REDUCTION INTERNAL FIXATION (ORIF) FIFTH METACARPAL;  Surgeon: Carole Civil, MD;  Location: [facility];  Service: Orthopedics;  Laterality: Right;  right fifth metacarpal    Prior to Admission medications   Medication Sig Start Date End Date Taking? Authorizing Provider  cyclobenzaprine (FLEXERIL) 5 MG tablet Take 1-2 tablets 3 times daily as needed 10/27/17   Laban Emperor, PA-C  ibuprofen (ADVIL,MOTRIN) 600 MG tablet Take 1 tablet (600 mg total) by mouth every 6 (six) hours as needed. 05/28/18   Rudene Re, MD    Allergies Patient has no known allergies.  No family history on file.  Social History Social History   Tobacco Use  . Smoking status: Current Every Day Smoker    Packs/day: 1.00    Types: Cigarettes  . Smokeless tobacco: Never Used  Substance Use Topics  . Alcohol use: Yes  . Drug  use: Not Currently    Comment: last use x1 month ago.     Review of Systems  Constitutional: No fever/chills Eyes: No visual changes. No discharge ENT: Positive for nasal congestion, sore throat Cardiovascular: no chest pain. Respiratory: Positive cough. No SOB. Gastrointestinal: No abdominal pain.  No nausea, no vomiting.  Positive diarrhea.  No constipation. Musculoskeletal: Negative for musculoskeletal pain. Skin: Negative for rash, abrasions, lacerations, ecchymosis. Neurological: Negative for headaches, focal weakness or numbness. 10-point ROS otherwise negative.  ____________________________________________   PHYSICAL EXAM:  VITAL SIGNS: ED Triage Vitals  Enc Vitals Group     BP 04/10/19 1015 128/74     Pulse Rate 04/10/19 1015 72     Resp 04/10/19 1015 18     Temp 04/10/19 1015 98.2 F (36.8 C)     Temp Source 04/10/19 1015 Oral     SpO2 04/10/19 1015 100 %     Weight 04/10/19 1016 150 lb (68 kg)     Height 04/10/19 1016 5\' 8"  (1.727 m)     Head Circumference --      Peak Flow --      Pain Score 04/10/19 1016 3     Pain Loc --      Pain Edu? --      Excl. in [place]? --      Constitutional: Alert and oriented. Well appearing and in no acute distress. Eyes: Conjunctivae are normal. PERRL. EOMI. Head: Atraumatic. ENT:      Ears:  Nose: Moderate clear congestion/rhinnorhea.      Mouth/Throat: Mucous membranes are moist.  Oropharynx is nonerythematous and nonedematous.  Uvula is midline Neck: No stridor.  Neck is supple full range of motion Hematological/Lymphatic/Immunilogical: No cervical lymphadenopathy. Cardiovascular: Normal rate, regular rhythm. Normal S1 and S2.  Good peripheral circulation. Respiratory: Normal respiratory effort without tachypnea or retractions. Lungs CTAB. Good air entry to the bases with no decreased or absent breath sounds. Gastrointestinal: Bowel sounds 4 quadrants. Soft and nontender to palpation. No guarding or rigidity. No  palpable masses. No distention. No CVA tenderness. Musculoskeletal: Full range of motion to all extremities. No gross deformities appreciated. Neurologic:  Normal speech and language. No gross focal neurologic deficits are appreciated.  Skin:  Skin is warm, dry and intact. No rash noted. Psychiatric: Mood and affect are normal. Speech and behavior are normal. Patient exhibits appropriate insight and judgement.   ____________________________________________   LABS (all labs ordered are listed, but only abnormal results are displayed)  Labs Reviewed  SARS CORONAVIRUS 2 (TAT 6-24 HRS)   ____________________________________________  EKG   ____________________________________________  RADIOLOGY   No results found.  ____________________________________________    PROCEDURES  Procedure(s) performed:    Procedures    Medications - No data to display   ____________________________________________   INITIAL IMPRESSION / ASSESSMENT AND PLAN / ED COURSE  Pertinent labs & imaging results that were available during my care of the patient were reviewed by me and considered in my medical decision making (see chart for details).  Review of the [place] CSRS was performed in accordance of the [facility] prior to dispensing any controlled drugs.           Patient's diagnosis is consistent with viral respiratory infection.  Patient presents emergency department with multiple URI symptoms as well as diarrhea.  Overall exam was reassuring with no significant acute findings.  At this time patient will be tested for COVID-19 also given his symptoms.  Patient will be prescribed Flonase, Zyrtec and Bromfed cough syrup for symptom relief.  Tylenol and Motrin at home for additional symptom control..  No indication for further labs or imaging at this time.  Follow-up primary care as needed.  Patient is given ED precautions to return to the ED for any worsening or new  symptoms.     ____________________________________________  FINAL CLINICAL IMPRESSION(S) / ED DIAGNOSES  Final diagnoses:  Viral respiratory illness      NEW MEDICATIONS STARTED DURING THIS VISIT:  ED Discharge Orders    None          This chart was dictated using voice recognition software/Dragon. Despite best efforts to proofread, errors can occur which can change the meaning. Any change was purely unintentional.    Racheal Patches, PA-C 04/10/19 1103    Emily Filbert, MD 04/10/19 1535

## 2019-04-11 ENCOUNTER — Telehealth: Payer: Self-pay | Admitting: General Practice

## 2019-04-11 NOTE — Telephone Encounter (Signed)
Negative COVID results given. Patient results "NOT Detected." Caller expressed understanding. ° °

## 2019-06-06 ENCOUNTER — Emergency Department
Admission: EM | Admit: 2019-06-06 | Discharge: 2019-06-06 | Disposition: A | Discharge status: Home / Self Care | Payer: Self-pay | Attending: Emergency Medicine | Admitting: Emergency Medicine

## 2019-06-06 ENCOUNTER — Emergency Department: Payer: Self-pay

## 2019-06-06 ENCOUNTER — Other Ambulatory Visit: Payer: Self-pay

## 2019-06-06 ENCOUNTER — Encounter: Payer: Self-pay | Admitting: Emergency Medicine

## 2019-06-06 DIAGNOSIS — F17210 Nicotine dependence, cigarettes, uncomplicated: Secondary | ICD-10-CM | POA: Insufficient documentation

## 2019-06-06 DIAGNOSIS — K0889 Other specified disorders of teeth and supporting structures: Secondary | ICD-10-CM | POA: Insufficient documentation

## 2019-06-06 DIAGNOSIS — R61 Generalized hyperhidrosis: Secondary | ICD-10-CM | POA: Insufficient documentation

## 2019-06-06 DIAGNOSIS — K029 Dental caries, unspecified: Secondary | ICD-10-CM

## 2019-06-06 LAB — COMPREHENSIVE METABOLIC PANEL
ALT: 15 U/L (ref 0–44)
AST: 22 U/L (ref 15–41)
Albumin: 4.6 g/dL (ref 3.5–5.0)
Alkaline Phosphatase: 61 U/L (ref 38–126)
Anion gap: 8 (ref 5–15)
BUN: 13 mg/dL (ref 6–20)
CO2: 22 mmol/L (ref 22–32)
Calcium: 9.3 mg/dL (ref 8.9–10.3)
Chloride: 110 mmol/L (ref 98–111)
Creatinine, Ser: 0.9 mg/dL (ref 0.61–1.24)
GFR calc Af Amer: >60 mL/min (ref >60)
GFR calc non Af Amer: >60 mL/min (ref >60)
Glucose, Bld: 91 mg/dL (ref 70–99)
Potassium: 3.8 mmol/L (ref 3.5–5.1)
Sodium: 140 mmol/L (ref 135–145)
Total Bilirubin: 0.7 mg/dL (ref 0.3–1.2)
Total Protein: 7.5 g/dL (ref 6.5–8.1)

## 2019-06-06 LAB — CBC WITH DIFFERENTIAL/PLATELET
Abs Immature Granulocytes: 0.04 10*3/uL (ref 0.00–0.07)
Basophils Absolute: 0 10*3/uL (ref 0.0–0.1)
Basophils Relative: 1 %
Eosinophils Absolute: 0.2 10*3/uL (ref 0.0–0.5)
Eosinophils Relative: 3 %
HCT: 43.8 % (ref 39.0–52.0)
Hemoglobin: 14.4 g/dL (ref 13.0–17.0)
Immature Granulocytes: 1 %
Lymphocytes Relative: 24 %
Lymphs Abs: 2 10*3/uL (ref 0.7–4.0)
MCH: 29.6 pg (ref 26.0–34.0)
MCHC: 32.9 g/dL (ref 30.0–36.0)
MCV: 90.1 fL (ref 80.0–100.0)
Monocytes Absolute: 0.7 10*3/uL (ref 0.1–1.0)
Monocytes Relative: 9 %
Neutro Abs: 5.3 10*3/uL (ref 1.7–7.7)
Neutrophils Relative %: 62 %
Platelets: 190 10*3/uL (ref 150–400)
RBC: 4.86 MIL/uL (ref 4.22–5.81)
RDW: 12.8 % (ref 11.5–15.5)
WBC: 8.3 10*3/uL (ref 4.0–10.5)
nRBC: 0 % (ref 0.0–0.2)

## 2019-06-06 LAB — C-REACTIVE PROTEIN: CRP: 1 mg/dL — ABNORMAL HIGH (ref <1.0)

## 2019-06-06 LAB — TSH: TSH: 0.512 u[IU]/mL (ref 0.350–4.500)

## 2019-06-06 MED ORDER — FAMOTIDINE IN NACL 20-0.9 MG/50ML-% IV SOLN
20.0000 mg | Freq: Once | INTRAVENOUS | Status: AC
  Administered 2019-06-06: 12:00:00 20 mg via INTRAVENOUS
  Filled 2019-06-06: qty 50

## 2019-06-06 MED ORDER — IBUPROFEN 600 MG PO TABS: 600.0000 mg | ORAL_TABLET | Freq: Three times a day (TID) | ORAL | 0 refills | Status: DC | PRN

## 2019-06-06 MED ORDER — AMOXICILLIN 500 MG PO CAPS: 500.0000 mg | ORAL_CAPSULE | Freq: Three times a day (TID) | ORAL | 0 refills | Status: DC

## 2019-06-06 NOTE — ED Provider Notes (Signed)
[facility] Emergency Department Provider Note   ____________________________________________   First MD Initiated Contact with Patient 06/06/19 1133     (approximate)  I have reviewed the triage vital signs and the nursing notes.   HISTORY  Chief Complaint Night Sweats    HPI Rodney Morgan. is a 24 y.o. male patient presents with couple weeks of night sweats.  Patient stated sweats are so bad he bought a fan but his next help with if the night sweats.  Patient denies weight loss, shortness of breath. Patient states intermitting left upper chest wall pain.  Patient denies recent travel or known contact with COVID-19.  Patient states there is increased fatigue.  Patient missed tobacco use.  Patient denies pain at this time.  No palliative measures for complaint.         History reviewed. No pertinent past medical history.  Patient Active Problem List   Diagnosis Date Noted  . Metacarpal bone fracture 01/23/2014    Past Surgical History:  Procedure Laterality Date  . OPEN REDUCTION INTERNAL FIXATION (ORIF) METACARPAL Right 01/25/2014   Procedure: OPEN REDUCTION INTERNAL FIXATION (ORIF) FIFTH METACARPAL;  Surgeon: Vickki Hearing, MD;  Location: [facility];  Service: Orthopedics;  Laterality: Right;  right fifth metacarpal    Prior to Admission medications   Medication Sig Start Date End Date Taking? Authorizing Provider  amoxicillin (AMOXIL) 500 MG capsule Take 1 capsule (500 mg total) by mouth 3 (three) times daily. 06/06/19   Joni Reining, PA-C  ibuprofen (ADVIL) 600 MG tablet Take 1 tablet (600 mg total) by mouth every 8 (eight) hours as needed. 06/06/19   Joni Reining, PA-C    Allergies Patient has no known allergies.  No family history on file.  Social History Social History   Tobacco Use  . Smoking status: Current Every Day Smoker    Packs/day: 1.00    Types: Cigarettes  . Smokeless tobacco: Never Used  Substance Use Topics    . Alcohol use: Yes  . Drug use: Not Currently    Comment: last use x1 month ago.    Review of Systems Constitutional: Fever.  Night sweats. Eyes: No visual changes. ENT: No sore throat. Cardiovascular: Left upper chest wall pain. Respiratory: Denies shortness of breath. Gastrointestinal: No abdominal pain.  No nausea, no vomiting.  No diarrhea.  No constipation. Genitourinary: Negative for dysuria. Musculoskeletal: Negative for back pain. Skin: Negative for rash. Neurological: Negative for headaches, focal weakness or numbness.   ____________________________________________   PHYSICAL EXAM:  VITAL SIGNS: ED Triage Vitals  Enc Vitals Group     BP 06/06/19 1046 126/68     Pulse Rate 06/06/19 1046 78     Resp 06/06/19 1046 18     Temp 06/06/19 1046 99.6 F (37.6 C)     Temp Source 06/06/19 1046 Oral     SpO2 06/06/19 1046 100 %     Weight 06/06/19 1028 150 lb (68 kg)     Height 06/06/19 1028 5\' 8"  (1.727 m)     Head Circumference --      Peak Flow --      Pain Score 06/06/19 1028 0     Pain Loc --      Pain Edu? --      Excl. in [place]? --    Constitutional: Alert and oriented. Well appearing and in no acute distress. Mouth/Throat: Mucous membranes are moist.  Oropharynx non-erythematous.  Devitalized molar #2 and 3.  Neck: No stridor.   Hematological/Lymphatic/Immunilogical: No cervical lymphadenopathy. Cardiovascular: Normal rate, regular rhythm. Grossly normal heart sounds.  Good peripheral circulation. Respiratory: Normal respiratory effort.  No retractions. Lungs CTAB. Gastrointestinal: Soft and nontender. No distention. No abdominal bruits. No CVA tenderness. Musculoskeletal: No lower extremity tenderness nor edema.  No joint effusions. Neurologic:  Normal speech and language. No gross focal neurologic deficits are appreciated. No gait instability. Skin:  Skin is warm, dry and intact. No rash noted. Psychiatric: Mood and affect are normal. Speech and behavior are  normal.  ____________________________________________   LABS (all labs ordered are listed, but only abnormal results are displayed)  Labs Reviewed  COMPREHENSIVE METABOLIC PANEL  CBC WITH DIFFERENTIAL/PLATELET  TSH  C-REACTIVE PROTEIN   ____________________________________________  EKG EKG read by heart station doctor with no acute findings.  ____________________________________________  RADIOLOGY  ED MD interpretation:    Official radiology report(s): DG Chest 2 View  Result Date: 06/06/2019 CLINICAL DATA:  Night sweats EXAM: CHEST - 2 VIEW COMPARISON:  March 28, 2018 FINDINGS: The lungs are clear. Heart size and pulmonary vascularity are normal. No adenopathy. No pneumothorax. No bone lesions. IMPRESSION: Lungs clear.  No adenopathy. Electronically Signed   By: Lowella Grip III M.D.   On: 06/06/2019 11:55    ____________________________________________   PROCEDURES  Procedure(s) performed (including Critical Care):  Procedures   ____________________________________________   INITIAL IMPRESSION / ASSESSMENT AND PLAN / ED COURSE  As part of my medical decision making, I reviewed the following data within the [place]     Patient presents with recent night sweats.  Patient denies recent travel or known exposure to COVID-19.  Patient has a history of dental abscess and caries which has not been treated by dentist.  Patient labs and checks x-ray was unremarkable.  Patient advised [facility].  Patient also advised to go to the [facility] for tuberculosis screening.  Take medication as directed.    Rodney Morgan. was evaluated in Emergency Department on 06/06/2019 for the symptoms described in the history of present illness. He was evaluated in the context of the global COVID-19 pandemic, which necessitated consideration that the patient might be at risk for infection with  the SARS-CoV-2 virus that causes COVID-19. Institutional protocols and algorithms that pertain to the evaluation of patients at risk for COVID-19 are in a state of rapid change based on information released by regulatory bodies including the CDC and federal and state organizations. These policies and algorithms were followed during the patient's care in the ED.       ____________________________________________   FINAL CLINICAL IMPRESSION(S) / ED DIAGNOSES  Final diagnoses:  Unexplained night sweats  Dental caries     ED Discharge Orders         Ordered    amoxicillin (AMOXIL) 500 MG capsule  3 times daily     06/06/19 1402    ibuprofen (ADVIL) 600 MG tablet  Every 8 hours PRN     06/06/19 1402           Note:  This document was prepared using Dragon voice recognition software and may include unintentional dictation errors.    Sable Feil, PA-C 06/06/19 1408    Harvest Dark, MD 06/06/19 [phone]

## 2019-06-06 NOTE — Discharge Instructions (Addendum)
Follow discharge care instruction contact the clinic listed above in your discharge care instruction.  Take medication as directed.  Take all paperwork given today to the health department and the [facility].

## 2019-06-06 NOTE — ED Triage Notes (Signed)
Pt reports for the past couple of weeks he has been sweating at night which is not normal. Pt reports he bought a fan to help control it but it has not worked. Pt denies weight loss, SOB or other sx's. Pt does reports some CP at times.

## 2019-06-06 NOTE — ED Notes (Signed)
See triage note  Presents with some episodes of night sweats    States this started about 2 weeks ago  He is unsure of feer  But has low grade temp on arrival    Also has had some intermittent sharp chest pain  Denies any pain at present  Also has had a dental abscess in past

## 2019-06-06 NOTE — ED Triage Notes (Signed)
Pt reports has been here for a tooth abscess but never had it taken care of and thinks it may be from that.

## 2019-08-02 ENCOUNTER — Encounter: Payer: Self-pay | Admitting: Emergency Medicine

## 2019-08-02 ENCOUNTER — Emergency Department
Admission: EM | Admit: 2019-08-02 | Discharge: 2019-08-02 | Disposition: A | Discharge status: Home / Self Care | Payer: BLUE CROSS/BLUE SHIELD | Attending: Emergency Medicine | Admitting: Emergency Medicine

## 2019-08-02 DIAGNOSIS — K029 Dental caries, unspecified: Secondary | ICD-10-CM | POA: Insufficient documentation

## 2019-08-02 DIAGNOSIS — R61 Generalized hyperhidrosis: Secondary | ICD-10-CM | POA: Insufficient documentation

## 2019-08-02 DIAGNOSIS — R82998 Other abnormal findings in urine: Secondary | ICD-10-CM | POA: Insufficient documentation

## 2019-08-02 DIAGNOSIS — F17210 Nicotine dependence, cigarettes, uncomplicated: Secondary | ICD-10-CM | POA: Insufficient documentation

## 2019-08-02 LAB — URINALYSIS, ROUTINE W REFLEX MICROSCOPIC
Bilirubin Urine: NEGATIVE
Glucose, UA: NEGATIVE mg/dL
Hgb urine dipstick: NEGATIVE
Ketones, ur: NEGATIVE mg/dL
Leukocytes,Ua: NEGATIVE
Nitrite: NEGATIVE
Protein, ur: 30 mg/dL — AB
Specific Gravity, Urine: 1.03 (ref 1.005–1.030)
pH: 5 (ref 5.0–8.0)

## 2019-08-02 MED ORDER — AMOXICILLIN-POT CLAVULANATE 875-125 MG PO TABS: 1.0000 | ORAL_TABLET | Freq: Two times a day (BID) | ORAL | 0 refills | Status: DC

## 2019-08-02 MED ORDER — IBUPROFEN 600 MG PO TABS: 600.0000 mg | ORAL_TABLET | Freq: Three times a day (TID) | ORAL | 0 refills | Status: DC | PRN

## 2019-08-02 MED ORDER — AMOXICILLIN-POT CLAVULANATE 875-125 MG PO TABS: 1.0000 | ORAL_TABLET | Freq: Two times a day (BID) | ORAL | 0 refills | Status: AC

## 2019-08-02 NOTE — ED Provider Notes (Signed)
[facility] Emergency Department Provider Note  ____________________________________________   First MD Initiated Contact with Patient 08/02/19 [phone]     (approximate)  I have reviewed the triage vital signs and the nursing notes.   HISTORY  Chief Complaint Fever and Dental Pain    HPI Rodney Morgan. is a 24 y.o. male who is otherwise healthy comes in for dental pain.  Patient states that he is having worsening upper back tooth pain over the past few days, constant, nothing makes better, nothing makes it worse.  The pain has been associated with some night sweats over the past 1 week.  He has not measured a temperature but has just felt hot and sweaty.  Patient states he has had this previously.  He was given amoxicillin and it seems to help his symptoms of both the pain and the night sweats.  Patient was also seen in early January for night sweats.  At that time patient was advised to establish care open to clinic for his dental pain as well as to go to the [facility] department for TB screening.  Patient never followed up with this.  Patient had a chest x-ray that was negative, coronavirus swab that was negative, CBC and CMP that were reassuring.  Negative CRP and normal TSH.  Patient was treated for dental caries and stated that he had improvement and symptoms went away.  Patient has not followed up for TB screening or with the open clinic for his dental pain.  Patient does report some change in the smell of his urine.  Patient has a fianc states that has been with her for 4 years.  Denies any high risk sexual behavior to suggest STDs or HIV.  He denies any other symptoms for Covid.  Denies any cough, shortness of breath, abdominal pain.           History reviewed. No pertinent past medical history.  Patient Active Problem List   Diagnosis Date Noted  . Metacarpal bone fracture 01/23/2014    Past Surgical History:  Procedure  Laterality Date  . OPEN REDUCTION INTERNAL FIXATION (ORIF) METACARPAL Right 01/25/2014   Procedure: OPEN REDUCTION INTERNAL FIXATION (ORIF) FIFTH METACARPAL;  Surgeon: Vickki Hearing, MD;  Location: [facility];  Service: Orthopedics;  Laterality: Right;  right fifth metacarpal    Prior to Admission medications   Medication Sig Start Date End Date Taking? Authorizing Provider  amoxicillin (AMOXIL) 500 MG capsule Take 1 capsule (500 mg total) by mouth 3 (three) times daily. 06/06/19   Joni Reining, PA-C  ibuprofen (ADVIL) 600 MG tablet Take 1 tablet (600 mg total) by mouth every 8 (eight) hours as needed. 06/06/19   Joni Reining, PA-C    Allergies Patient has no known allergies.  History reviewed. No pertinent family history.  Social History Social History   Tobacco Use  . Smoking status: Current Every Day Smoker    Packs/day: 1.00    Types: Cigarettes  . Smokeless tobacco: Never Used  Substance Use Topics  . Alcohol use: Yes  . Drug use: Not Currently    Comment: last use x1 month ago.      Review of Systems Constitutional: Positive chills Eyes: No visual changes. ENT: No sore throat.  Positive dental pain Cardiovascular: Denies chest pain. Respiratory: Denies shortness of breath. Gastrointestinal: No abdominal pain.  No nausea, no vomiting.  No diarrhea.  No constipation. Genitourinary: Negative for dysuria.  Change in smell of  urine Musculoskeletal: Negative for back pain. Skin: Negative for rash. Neurological: Negative for headaches, focal weakness or numbness. All other ROS negative ____________________________________________   PHYSICAL EXAM:  VITAL SIGNS: ED Triage Vitals  Enc Vitals Group     BP 08/02/19 0636 129/77     Pulse Rate 08/02/19 0636 90     Resp --      Temp 08/02/19 0636 (!) 97.5 F (36.4 C)     Temp Source 08/02/19 0636 Oral     SpO2 08/02/19 0636 98 %     Weight --      Height --      Head Circumference --      Peak Flow --      Pain  Score 08/02/19 0638 0     Pain Loc --      Pain Edu? --      Excl. in GC? --     Constitutional: Alert and oriented. Well appearing and in no acute distress. Eyes: Conjunctivae are normal. EOMI. Head: Atraumatic. Nose: No congestion/rhinnorhea. Mouth/Throat: Mucous membranes are moist.  Devitalized molar teeth in the upper left.  Teeth #16/15 OP clear otherwise Neck: No stridor. Trachea Midline. FROM Cardiovascular: Normal rate, regular rhythm. Grossly normal heart sounds.  Good peripheral circulation. Respiratory: Normal respiratory effort.  No retractions. Lungs CTAB. Gastrointestinal: Soft and nontender. No distention. No abdominal bruits.  Musculoskeletal: No lower extremity tenderness nor edema.  No joint effusions. Neurologic:  Normal speech and language. No gross focal neurologic deficits are appreciated.  Skin:  Skin is warm, dry and intact. No rash noted. Psychiatric: Mood and affect are normal. Speech and behavior are normal. GU: Deferred   ____________________________________________   LABS (all labs ordered are listed, but only abnormal results are displayed)  Labs Reviewed  URINALYSIS, ROUTINE W REFLEX MICROSCOPIC - Abnormal; Notable for the following components:      Result Value   Color, Urine YELLOW (*)    APPearance HAZY (*)    Protein, ur 30 (*)    Bacteria, UA RARE (*)    All other components within normal limits  URINE CULTURE   ____________________________________________  RADIOLOGY  ____________________________________________   PROCEDURES  Procedure(s) performed (including Critical Care):  Procedures   ____________________________________________   INITIAL IMPRESSION / ASSESSMENT AND PLAN / ED COURSE  Rodney Morgan. was evaluated in Emergency Department on 08/02/2019 for the symptoms described in the history of present illness. He was evaluated in the context of the global COVID-19 pandemic, which necessitated consideration that the  patient might be at risk for infection with the SARS-CoV-2 virus that causes COVID-19. Institutional protocols and algorithms that pertain to the evaluation of patients at risk for COVID-19 are in a state of rapid change based on information released by regulatory bodies including the CDC and federal and state organizations. These policies and algorithms were followed during the patient's care in the ED.    Patient is a 24 year old who comes in with report of night sweats for a week.  Patient is currently afebrile is not use any fever reducers.  Patient has been seen here before for the similar symptoms and stated that it got better with treatment for his dental carriers.  Patient never followed up with TB testing.  Chest x-ray was done last time that showed no obvious nodule.  I think there is little utility of doing repeat x-ray today.  Again I encourage patient to follow-up for TB and consideration of HIV testing although he does  report being low risk.  We also discussed he needs to follow-up with the dental clinic to get definitive management of this tooth.  Patient expressed understanding.   Tooth pain is most likely from dental carriers and pulpitis no fluctuation to suggest abscess at this time.  No swelling of the jaw.  Oropharynx is otherwise clear.   Will get urine to evaluate for UTI given report of change in the color of his urine.  Denies any risk factors for STDs. Denies history of IV drugs and no evidence of endocarditis on exam.  Patient is alert and well-appearing does not appear septic or meningitic or bacteremic.   UA is negative for infection.  We will cover patient for dental carriers and patient given follow-up for dental clinics as well as health department test   I discussed the provisional nature of ED diagnosis, the treatment so far, the ongoing plan of care, follow up appointments and return precautions with the patient and any family or support people present. They expressed  understanding and agreed with the plan, discharged home.     ____________________________________________   FINAL CLINICAL IMPRESSION(S) / ED DIAGNOSES   Final diagnoses:  Dental caries  Night sweats      MEDICATIONS GIVEN DURING THIS VISIT:  Medications - No data to display   ED Discharge Orders         Ordered    amoxicillin-clavulanate (AUGMENTIN) 875-125 MG tablet  2 times daily     08/02/19 0800    ibuprofen (ADVIL) 600 MG tablet  Every 8 hours PRN     08/02/19 0800           Note:  This document was prepared using Dragon voice recognition software and may include unintentional dictation errors.   Vanessa [place], MD 08/02/19 [phone]

## 2019-08-02 NOTE — Discharge Instructions (Addendum)
You were afebrile here but given your tooth pain this could be secondary to dental infection.  It is important that you follow-up with a dentist for definitive management.  We also recommend following up at the health department to consider TB and HIV testing.  Return to the ER for any other concerns    [facility]  [place] [facility] and [place] [email or web address] [phone])  [facility] [phone])  [place] [phone] ext [phone])  [place] [phone])  [facility] [phone]) This clinic caters to the indigent population and is on a lottery system. Location: [employer] [facility], [place], [place], [place] Clinic Hours: Wednesdays from 6pm - 9pm, patients seen by a lottery system. For dates, call or go to [email or web address] Services: Cleanings, fillings and simple extractions. Payment Options: DENTAL WORK IS FREE OF CHARGE. Bring proof of income or support. Best way to get seen: Arrive at 5:15 pm - this is a lottery, NOT first come/first serve, so arriving earlier will not increase your chances of being seen.     [facility] [phone] Select option 1 for emergencies   Location: [facility], [place], [address], [place] Clinic Hours: No walk-ins accepted - call the day before to schedule an appointment. Check in times are 9:30 am and 1:30 pm. Services: Simple extractions, temporary fillings, pulpectomy/pulp debridement, uncomplicated abscess drainage. Payment Options: PAYMENT IS DUE AT THE TIME OF SERVICE.  Fee is usually $100-200, additional surgical procedures (e.g. abscess drainage) may be extra. Cash, checks, Visa/MasterCard accepted.  Can file Medicaid if patient  is covered for dental - patient should call case worker to check. No discount for [facility] patients. Best way to get seen: MUST call the day before and get onto the schedule. Can usually be seen the next 1-2 days. No walk-ins accepted.     [place] [phone]   Location: [place], [place] [facility] Hours: M, W, Th, F 8am or 1:30pm, Tues 9a or 1:30 - first come/first served. Services: Simple extractions, temporary fillings, uncomplicated abscess drainage.  You do not need to be an [facility] resident. Payment Options: PAYMENT IS DUE AT THE TIME OF SERVICE. Dental insurance, otherwise sliding scale - bring proof of income or support. Depending on income and treatment needed, cost is usually $50-200. Best way to get seen: Arrive early as it is first come/first served.     [facility] [phone]   Location: [place] [facility] Hours: Mon-Thu 8a-5p Services: Most basic dental services including extractions and fillings. Payment Options: PAYMENT IS DUE AT THE TIME OF SERVICE. Sliding scale, up to 50% off - bring proof if income or support. Medicaid with dental option accepted. Best way to get seen: Call to schedule an appointment, can usually be seen within 2 weeks OR they will try to see walk-ins - show up at [place] or 2p (you may have to wait).     [facility] [place] RESIDENTS ONLY   Location: [facility], [place] [address], [place], [place] [address] Clinic Hours: By appointment only. Monday - Thursday 8am-5pm, Friday 8am-12pm Services: Cleanings, fillings, extractions. Payment Options: PAYMENT IS DUE AT THE TIME OF SERVICE. Cash, Visa or MasterCard. Sliding scale - $30 minimum per service.  Best way to get seen: Come in to office, complete packet and make an appointment - need proof of income or support  monies for each household member and proof of [facility] residence. Usually takes about a month to get in.     [facility] [phone]   Location: [address] [facility] Clinic Hours: Walk-in Urgent Care Dental Services are offered Monday-Friday mornings only. The numbers of emergencies accepted daily is limited to the number of providers available. Maximum 15 - Mondays, Wednesdays & Thursdays Maximum 10 - Tuesdays & Fridays Services: You do not need to be a [facility] resident to be seen for a dental emergency. Emergencies are defined as pain, swelling, abnormal bleeding, or dental trauma. Walkins will receive x-rays if needed. NOTE: Dental cleaning is not an emergency. Payment Options: PAYMENT IS DUE AT THE TIME OF SERVICE. Minimum co-pay is $40.00 for uninsured patients. Minimum co-pay is $3.00 for Medicaid with dental coverage. Dental Insurance is accepted and must be presented at time of visit. Medicare does not cover dental. Forms of payment: Cash, credit card, checks. Best way to get seen: If not previously registered with the clinic, walk-in dental registration begins at 7:15 am and is on a first come/first serve basis. If previously registered with the clinic, call to make an appointment.     [facility] [phone] [place] RESIDENTS ONLY   Location: [address], [place], [place] Clinic Hours: Mon-Thu 10a-2p Services: Extractions only! Payment Options: FREE (donations accepted) - bring proof of income or support Best way to get seen: Call and schedule an appointment OR come at 8am on the 1st Monday of every month (except for holidays) when it is first come/first served.     [facility] [phone]   Location: [address] [place], [place] Clinic Hours: Friday mornings Services, Payment Options, Best way to get seen: Call for info

## 2019-08-02 NOTE — ED Notes (Addendum)
Pt reports abscessed tooth, has taken antibiotics (amoxacillin) previously but doesn't believe that this cleared the infection. In the past week pt reports chills/ hot flashes, for the past two nights has also been experiencing night sweats.   Also reports his head is feeling "foggy".

## 2019-08-02 NOTE — ED Triage Notes (Signed)
Pt c/o upper left dental pain for "Months" and in the last week pt has had fever and chills at night. Pt denies drainage from tooth.

## 2019-08-03 LAB — URINE CULTURE: Culture: NO GROWTH

## 2020-02-06 ENCOUNTER — Emergency Department
Admission: EM | Admit: 2020-02-06 | Discharge: 2020-02-06 | Disposition: A | Discharge status: Home / Self Care | Payer: BLUE CROSS/BLUE SHIELD | Attending: Emergency Medicine | Admitting: Emergency Medicine

## 2020-02-06 ENCOUNTER — Encounter: Payer: Self-pay | Admitting: Emergency Medicine

## 2020-02-06 ENCOUNTER — Other Ambulatory Visit: Payer: Self-pay

## 2020-02-06 ENCOUNTER — Emergency Department: Payer: BLUE CROSS/BLUE SHIELD

## 2020-02-06 DIAGNOSIS — K0889 Other specified disorders of teeth and supporting structures: Secondary | ICD-10-CM

## 2020-02-06 DIAGNOSIS — M79641 Pain in right hand: Secondary | ICD-10-CM | POA: Insufficient documentation

## 2020-02-06 DIAGNOSIS — Z79899 Other long term (current) drug therapy: Secondary | ICD-10-CM | POA: Insufficient documentation

## 2020-02-06 DIAGNOSIS — F17210 Nicotine dependence, cigarettes, uncomplicated: Secondary | ICD-10-CM | POA: Insufficient documentation

## 2020-02-06 DIAGNOSIS — M25511 Pain in right shoulder: Secondary | ICD-10-CM

## 2020-02-06 MED ORDER — NAPROXEN 500 MG PO TABS: 500.0000 mg | ORAL_TABLET | Freq: Two times a day (BID) | ORAL | 0 refills | Status: DC

## 2020-02-06 MED ORDER — AMOXICILLIN 875 MG PO TABS: 875.0000 mg | ORAL_TABLET | Freq: Two times a day (BID) | ORAL | 0 refills | Status: DC

## 2020-02-06 NOTE — ED Triage Notes (Signed)
Pt reports had screws placed in his right hand years ago and is now having pain in that hand again. Pt also reports some pain to his right shoulder blade, denies injuries,

## 2020-02-06 NOTE — ED Provider Notes (Signed)
[facility] Emergency Department Provider Note  ____________________________________________   First MD Initiated Contact with Patient 02/06/20 1120     (approximate)  I have reviewed the triage vital signs and the nursing notes.   HISTORY  Chief Complaint Shoulder Pain and Hand Pain   HPI Rodney Morgan. is a 24 y.o. male presents to the ED with complaint of right hand pain for the last 2 to 3 weeks.  Patient states he had surgery with a screw placed in 2015 and was told to follow-up with the orthopedist when he turned 20.  He also has developed some pain in his right shoulder which began when he was moving.  He now has pain with range of motion.  He denies any direct trauma to his shoulder.  Patient also complains of left upper dental pain.  He does not have a dentist.  He denies any fever or chills.  He rates his pain as 3 out of 10.       History reviewed. No pertinent past medical history.  Patient Active Problem List   Diagnosis Date Noted  . Metacarpal bone fracture 01/23/2014    Past Surgical History:  Procedure Laterality Date  . OPEN REDUCTION INTERNAL FIXATION (ORIF) METACARPAL Right 01/25/2014   Procedure: OPEN REDUCTION INTERNAL FIXATION (ORIF) FIFTH METACARPAL;  Surgeon: Vickki Hearing, MD;  Location: [facility];  Service: Orthopedics;  Laterality: Right;  right fifth metacarpal    Prior to Admission medications   Medication Sig Start Date End Date Taking? Authorizing Provider  amoxicillin (AMOXIL) 875 MG tablet Take 1 tablet (875 mg total) by mouth 2 (two) times daily. 02/06/20   Tommi Rumps, PA-C  ibuprofen (ADVIL) 600 MG tablet Take 1 tablet (600 mg total) by mouth every 8 (eight) hours as needed. 08/02/19   Concha Se, MD  naproxen (NAPROSYN) 500 MG tablet Take 1 tablet (500 mg total) by mouth 2 (two) times daily with a meal. 02/06/20   Tommi Rumps, PA-C    Allergies Patient has no known allergies.  No family history  on file.  Social History Social History   Tobacco Use  . Smoking status: Current Every Day Smoker    Packs/day: 1.00    Types: Cigarettes  . Smokeless tobacco: Never Used  Vaping Use  . Vaping Use: Never used  Substance Use Topics  . Alcohol use: Yes  . Drug use: Not Currently    Comment: last use x1 month ago.    Review of Systems Constitutional: No fever/chills Eyes: No visual changes. Cardiovascular: Denies chest pain. Respiratory: Denies shortness of breath. Musculoskeletal: Positive for right hand pain and right shoulder pain. Skin: Negative for rash. Neurological: Negative for headaches, focal weakness or numbness. ____________________________________________   PHYSICAL EXAM:  VITAL SIGNS: ED Triage Vitals  Enc Vitals Group     BP 02/06/20 1102 124/81     Pulse Rate 02/06/20 1102 74     Resp --      Temp 02/06/20 1102 98.2 F (36.8 C)     Temp Source 02/06/20 1102 Oral     SpO2 02/06/20 1102 99 %     Weight 02/06/20 1100 150 lb (68 kg)     Height 02/06/20 1100 5\' 8"  (1.727 m)     Head Circumference --      Peak Flow --      Pain Score 02/06/20 1100 3     Pain Loc --  Pain Edu? --      Excl. in GC? --     Constitutional: Alert and oriented. Well appearing and in no acute distress. Eyes: Conjunctivae are normal. PERRL. EOMI. Head: Atraumatic. Neck: No stridor.   Mouth: Left upper posterior molar in poor repair with gum edema.  No active drainage is noted. Cardiovascular: Normal rate, regular rhythm. Grossly normal heart sounds.  Good peripheral circulation. Respiratory: Normal respiratory effort.  No retractions. Lungs CTAB. Musculoskeletal: Examination of the right hand there is well-healed surgical scar on the lateral aspect of the fifth meta carpal.  No erythema or soft tissue edema present.  Patient is able to flex and extend digits without any difficulty.  Capillary refills less than 3 seconds.  On examination of the right shoulder there is no  gross deformity but there is a mild amount of crepitus with range of motion.  Skin is intact and no discoloration noted.  Patient is also tender medial parascapular muscle. Neurologic:  Normal speech and language. No gross focal neurologic deficits are appreciated. No gait instability. Skin:  Skin is warm, dry and intact.  Psychiatric: Mood and affect are normal. Speech and behavior are normal.  ____________________________________________   LABS (all labs ordered are listed, but only abnormal results are displayed)  Labs Reviewed - No data to display  RADIOLOGY   Official radiology report(s): DG Shoulder Right  Result Date: 02/06/2020 CLINICAL DATA:  Pain EXAM: RIGHT SHOULDER - 2+ VIEW COMPARISON:  None. FINDINGS: Oblique, Y scapular, and axillary images were obtained. No fracture or dislocation. Joint spaces appear normal. No erosive change or intra-articular calcification. Visualized right lung clear. IMPRESSION: No fracture or dislocation.  No evident arthropathy. Electronically Signed   By: Bretta Bang III M.D.   On: 02/06/2020 12:26   DG Hand Complete Right  Result Date: 02/06/2020 CLINICAL DATA:  History of prior hand surgery.  Pain. EXAM: RIGHT HAND - COMPLETE 3+ VIEW COMPARISON:  01/18/2014. FINDINGS: Plate and screw fixation of the right fifth metacarpal noted. Hardware intact. Anatomic alignment. No acute bony or joint abnormality identified. No evidence of acute fracture. No evidence of dislocation. IMPRESSION: 1. Prior plate and screw fixation right fifth metacarpal. Hardware intact. Anatomic alignment. 2.  No acute abnormality identified. Electronically Signed   By: Maisie Fus  Register   On: 02/06/2020 12:26    ____________________________________________   PROCEDURES  Procedure(s) performed (including Critical Care):  Procedures   ____________________________________________   INITIAL IMPRESSION / ASSESSMENT AND PLAN / ED COURSE  As part of my medical decision  making, I reviewed the following data within the electronic MEDICAL RECORD NUMBER Notes from prior ED visits and [place] Controlled Substance Database  Rodney Elpers. was evaluated in Emergency Department on 02/06/2020 for the symptoms described in the history of present illness. He was evaluated in the context of the global COVID-19 pandemic, which necessitated consideration that the patient might be at risk for infection with the SARS-CoV-2 virus that causes COVID-19. Institutional protocols and algorithms that pertain to the evaluation of patients at risk for COVID-19 are in a state of rapid change based on information released by regulatory bodies including the CDC and federal and state organizations. These policies and algorithms were followed during the patient's care in the ED.  24 year old male presents to the ED with complaint of right hand pain for several weeks.  Patient had a fracture and a open reduction with hardware in 2015.  He has no new injury to his hand but increased  soreness in the area of his surgery.  He also has right shoulder pain without history of direct trauma.  He states that he was lifting when he felt a pop.  X-rays were reassuring.  Patient is also being treated for a dental abscess left upper posterior molar with amoxicillin.  A prescription for naproxen was sent to his pharmacy as well for inflammation.  Patient is to follow-up with Dr. Joice Lofts who is on-call for orthopedics and since patient now lives in [place].  Patient was told by the orthopedist who initially did the surgery that the screw would need to come out in the future. ____________________________________________   FINAL CLINICAL IMPRESSION(S) / ED DIAGNOSES  Final diagnoses:  Right hand pain  Acute pain of right shoulder  Pain, dental     ED Discharge Orders         Ordered    amoxicillin (AMOXIL) 875 MG tablet  2 times daily        02/06/20 1236    naproxen (NAPROSYN) 500 MG tablet  2 times daily with  meals        02/06/20 1236           Note:  This document was prepared using Dragon voice recognition software and may include unintentional dictation errors.    Tommi Rumps, PA-C 02/06/20 1302    Sharyn Creamer, MD 02/06/20 2135

## 2020-02-06 NOTE — Discharge Instructions (Addendum)
Call make an appointment with Dr. Joice Lofts who is the orthopedist on-call and is over at the orthopedic department at [facility].  If you continue to have problems with your hand he may want to take the hardware out of your hand.  Also use ice or heat to your shoulder as needed for discomfort.  The naproxen is twice a day with food.  The amoxicillin 3 times a day for 10 days.  Do not stop until you have completely finished it.  A list of dental clinics is listed on your discharge papers.  Also a separate paper with the 2 walk-in clinics is attached to your discharge papers.  OPTIONS FOR DENTAL FOLLOW UP CARE  [place] [facility] - Local Safety Net Dental Clinics [email or web address]   [facility] [phone])  Sh[facility] Ma [phone])  [place] [phone])  [facility] [phone])  [facility] [phone]) This clinic caters to the indigent population and is on a lottery system. Location: [employer] [facility], [employer], [address], [place] Clinic Hours: Wednesdays from 6pm - 9pm, patients seen by a lottery system. For dates, call or go to [email or web address] Services: Cleanings, fillings and simple extractions. Payment Options: DENTAL WORK IS FREE OF CHARGE. Bring proof of income or support. Best way to get seen: Arrive at 5:15 pm - this is a lottery, NOT first come/first serve, so arriving earlier will not increase your chances of being seen.     [facility] [phone] Select option 1 for emergencies   Location: [facility], [place], [address], [place] Clinic Hours: No walk-ins accepted - call the day before to schedule an appointment. Check in times are 9:30 am and 1:30 pm. Services: Simple extractions, temporary fillings,  pulpectomy/pulp debridement, uncomplicated abscess drainage. Payment Options: PAYMENT IS DUE AT THE TIME OF SERVICE.  Fee is usually $100-200, additional surgical procedures (e.g. abscess drainage) may be extra. Cash, checks, Visa/MasterCard accepted.  Can file Medicaid if patient is covered for dental - patient should call case worker to check. No discount for [facility] patients. Best way to get seen: MUST call the day before and get onto the schedule. Can usually be seen the next 1-2 days. No walk-ins accepted.     [facility] [phone]   Location: [facility], [address] [place] Clinic Hours: M, W, Th, F 8am or 1:30pm, Tues 9a or 1:30 - first come/first served. Services: Simple extractions, temporary fillings, uncomplicated abscess drainage.  You do not need to be an [facility] resident. Payment Options: PAYMENT IS DUE AT THE TIME OF SERVICE. Dental insurance, otherwise sliding scale - bring proof of income or support. Depending on income and treatment needed, cost is usually $50-200. Best way to get seen: Arrive early as it is first come/first served.     [facility] [phone]   Location: [address] Clinic Hours: Mon-Thu 8a-5p Services: Most basic dental services including extractions and fillings. Payment Options: PAYMENT IS DUE AT THE TIME OF SERVICE. Sliding scale, up to 50% off - bring proof if income or support. Medicaid with dental option accepted. Best way to get seen: Call to schedule an appointment, can usually be seen within 2 weeks OR they will try to see walk-ins - show up at 8a or 2p (you may have to wait).     [facility] [facility] [phone] [facility] RESIDENTS ONLY   Location: [facility], [address], [place], [place] [address] Clinic Hours: By appointment only. Monday - Thursday 8am-5pm, Friday  8am-12pm Services: Cleanings, fillings, extractions. Payment Options: PAYMENT IS DUE AT THE TIME OF SERVICE. Cash, Visa or MasterCard. Sliding scale - $30 minimum per service. Best way to get seen: Come in to office, complete packet and make an appointment - need proof of income or support monies for each household member and proof of [facility] residence. Usually takes about a month to get in.     [facility] [phone]   Location: [address] [facility] Clinic Hours: Walk-in Urgent Care Dental Services are offered Monday-Friday mornings only. The numbers of emergencies accepted daily is limited to the number of providers available. Maximum 15 - Mondays, Wednesdays & Thursdays Maximum 10 - Tuesdays & Fridays Services: You do not need to be a [facility] resident to be seen for a dental emergency. Emergencies are defined as pain, swelling, abnormal bleeding, or dental trauma. Walkins will receive x-rays if needed. NOTE: Dental cleaning is not an emergency. Payment Options: PAYMENT IS DUE AT THE TIME OF SERVICE. Minimum co-pay is $40.00 for uninsured patients. Minimum co-pay is $3.00 for Medicaid with dental coverage. Dental Insurance is accepted and must be presented at time of visit. Medicare does not cover dental. Forms of payment: Cash, credit card, checks. Best way to get seen: If not previously registered with the clinic, walk-in dental registration begins at 7:15 am and is on a first come/first serve basis. If previously registered with the clinic, call to make an appointment.     [facility] [phone] [place] RESIDENTS ONLY   Location: [address] [place], [place] Clinic Hours: Mon-Thu 10a-2p Services: Extractions only! Payment Options: FREE (donations accepted) - bring proof of income or support Best way to get seen: Call and schedule an appointment OR come at 8am on the 1st Monday of every month  (except for holidays) when it is first come/first served.     [facility] [phone]   Location: [address] [place], [place] Clinic Hours: Friday mornings Services, Payment Options, Best way to get seen: Call for info

## 2020-07-09 ENCOUNTER — Encounter: Payer: Self-pay | Admitting: Emergency Medicine

## 2020-07-09 ENCOUNTER — Emergency Department
Admission: EM | Admit: 2020-07-09 | Discharge: 2020-07-09 | Disposition: A | Discharge status: Home / Self Care | Payer: Self-pay | Attending: Emergency Medicine | Admitting: Emergency Medicine

## 2020-07-09 ENCOUNTER — Other Ambulatory Visit: Payer: Self-pay

## 2020-07-09 DIAGNOSIS — F17210 Nicotine dependence, cigarettes, uncomplicated: Secondary | ICD-10-CM | POA: Insufficient documentation

## 2020-07-09 DIAGNOSIS — L0100 Impetigo, unspecified: Secondary | ICD-10-CM | POA: Insufficient documentation

## 2020-07-09 MED ORDER — MUPIROCIN 2 % EX OINT: TOPICAL_OINTMENT | CUTANEOUS | 0 refills | Status: AC

## 2020-07-09 MED ORDER — CEPHALEXIN 500 MG PO CAPS: 500.0000 mg | ORAL_CAPSULE | Freq: Four times a day (QID) | ORAL | 0 refills | Status: AC

## 2020-07-09 MED ORDER — CEPHALEXIN 500 MG PO CAPS
500.0000 mg | ORAL_CAPSULE | Freq: Once | ORAL | Status: AC
  Administered 2020-07-09: 500 mg via ORAL
  Filled 2020-07-09: qty 1

## 2020-07-09 NOTE — ED Provider Notes (Signed)
[facility] Emergency Department Provider Note ____________________________________________  Time seen: 0913  I have reviewed the triage vital signs and the nursing notes.  HISTORY  Chief Complaint  Rash   HPI Rodney Kuch. is a 24 y.o. male presents to the ED with concern of spreading rash to his left upper arm and trunk.  Patient describes onset initially occurred in his left antecubital space after he received a new tattoo.  He describes initially some itchy pustules and papules in the area, that seem to spread over his torso and abdomen, over the last few days.  He denies any interim fever, chills, or sweats.   History reviewed. No pertinent past medical history.  Patient Active Problem List   Diagnosis Date Noted  . Metacarpal bone fracture 01/23/2014    Past Surgical History:  Procedure Laterality Date  . OPEN REDUCTION INTERNAL FIXATION (ORIF) METACARPAL Right 01/25/2014   Procedure: OPEN REDUCTION INTERNAL FIXATION (ORIF) FIFTH METACARPAL;  Surgeon: Vickki Hearing, MD;  Location: [facility];  Service: Orthopedics;  Laterality: Right;  right fifth metacarpal    Prior to Admission medications   Medication Sig Start Date End Date Taking? Authorizing Provider  cephALEXin (KEFLEX) 500 MG capsule Take 1 capsule (500 mg total) by mouth 4 (four) times daily for 7 days. 07/09/20 07/16/20 Yes Josalynn Johndrow, Charlesetta Ivory, PA-C  mupirocin ointment (BACTROBAN) 2 % Apply to affected area 3 times daily 07/09/20  Yes Azora Bonzo, Charlesetta Ivory, PA-C    Allergies Patient has no known allergies.  History reviewed. No pertinent family history.  Social History Social History   Tobacco Use  . Smoking status: Current Every Day Smoker    Packs/day: 1.00    Types: Cigarettes  . Smokeless tobacco: Never Used  Vaping Use  . Vaping Use: Never used  Substance Use Topics  . Alcohol use: Yes  . Drug use: Not Currently    Comment: last use x1 month ago.    Review of  Systems  Constitutional: Negative for fever. Eyes: Negative for visual changes. ENT: Negative for sore throat. Cardiovascular: Negative for chest pain. Respiratory: Negative for shortness of breath. Gastrointestinal: Negative for abdominal pain, vomiting and diarrhea. Genitourinary: Negative for dysuria. Musculoskeletal: Negative for back pain. Skin: Positive for rash. Neurological: Negative for headaches, focal weakness or numbness. ____________________________________________  PHYSICAL EXAM:  VITAL SIGNS: ED Triage Vitals  Enc Vitals Group     BP 07/09/20 0851 123/67     Pulse Rate 07/09/20 0851 71     Resp 07/09/20 0851 18     Temp 07/09/20 0851 (!) 97.4 F (36.3 C)     Temp Source 07/09/20 0851 Oral     SpO2 07/09/20 0851 98 %     Weight 07/09/20 0858 149 lb 14.6 oz (68 kg)     Height 07/09/20 0858 5\' 8"  (1.727 m)     Head Circumference --      Peak Flow --      Pain Score 07/09/20 0932 0     Pain Loc --      Pain Edu? --      Excl. in GC? --     Constitutional: Alert and oriented. Well appearing and in no distress. Head: Normocephalic and atraumatic. Eyes: Conjunctivae are normal. PERRL. Normal extraocular movements Cardiovascular: Normal rate, regular rhythm. Normal distal pulses. Respiratory: Normal respiratory effort. No wheezes/rales/rhonchi. Gastrointestinal: Soft and nontender. No distention. Musculoskeletal: Nontender with normal range of motion in all extremities.  Neurologic:  Normal gait without ataxia. Normal speech and language. No gross focal neurologic deficits are appreciated. Skin:  Skin is warm, dry and intact. No rash noted. Psychiatric: Mood and affect are normal. Patient exhibits appropriate insight and judgment. ____________________________________________  PROCEDURES  Keflex 500 mg PO  Procedures ____________________________________________  INITIAL IMPRESSION / ASSESSMENT AND PLAN / ED COURSE  Patient presents to the ED for  evaluation of multiple papules and pustules to the left upper extremity and trunk.  Symptoms are consistent with a likely staph infection, impetigo, related to her recent tattoo.  Patient be treated empirically with Bactroban ointment and Keflex tablets.  He is advised to keep the area clean, dry, and covered.  He will follow with primary provider for ongoing symptoms peer return precautions have been discussed.   Rodney Wickham. was evaluated in Emergency Department on 07/09/2020 for the symptoms described in the history of present illness. He was evaluated in the context of the global COVID-19 pandemic, which necessitated consideration that the patient might be at risk for infection with the SARS-CoV-2 virus that causes COVID-19. Institutional protocols and algorithms that pertain to the evaluation of patients at risk for COVID-19 are in a state of rapid change based on information released by regulatory bodies including the CDC and federal and state organizations. These policies and algorithms were followed during the patient's care in the ED. ____________________________________________  FINAL CLINICAL IMPRESSION(S) / ED DIAGNOSES  Final diagnoses:  Impetigo      Karmen Stabs, Charlesetta Ivory, PA-C 07/09/20 1631    Chesley Noon, MD 07/10/20 0830

## 2020-07-09 NOTE — Discharge Instructions (Addendum)
Take the antibiotic pills and use the ointment as directed. Use a daily bodywash as discussed. Follow-up with [facility] or return if needed.

## 2020-07-09 NOTE — ED Triage Notes (Signed)
Pt in w/complaints of bumpy, itchy rash that started 4 days ago, began at area of L bicep 4 days after new tattoo. Rash is tender to touch, now spread onto abdomen. No fevers

## 2020-07-09 NOTE — ED Notes (Signed)
See triage note  Presents with rash to left arms,abd and legs  States he has a new tattoo to left arm about 10 days ago  States he noticed redness and rash to left AC area  Then states rash spread up arm and also noticed areas to abd and leg

## 2020-08-29 ENCOUNTER — Other Ambulatory Visit: Payer: Self-pay

## 2020-08-29 ENCOUNTER — Emergency Department
Admission: EM | Admit: 2020-08-29 | Discharge: 2020-08-29 | Disposition: A | Discharge status: Home / Self Care | Payer: Self-pay | Attending: Emergency Medicine | Admitting: Emergency Medicine

## 2020-08-29 ENCOUNTER — Emergency Department: Payer: Self-pay

## 2020-08-29 DIAGNOSIS — R0781 Pleurodynia: Secondary | ICD-10-CM | POA: Insufficient documentation

## 2020-08-29 DIAGNOSIS — Z23 Encounter for immunization: Secondary | ICD-10-CM | POA: Insufficient documentation

## 2020-08-29 DIAGNOSIS — S29012A Strain of muscle and tendon of back wall of thorax, initial encounter: Secondary | ICD-10-CM | POA: Insufficient documentation

## 2020-08-29 DIAGNOSIS — W268XXA Contact with other sharp object(s), not elsewhere classified, initial encounter: Secondary | ICD-10-CM | POA: Insufficient documentation

## 2020-08-29 DIAGNOSIS — S61216A Laceration without foreign body of right little finger without damage to nail, initial encounter: Secondary | ICD-10-CM | POA: Insufficient documentation

## 2020-08-29 DIAGNOSIS — Y9289 Other specified places as the place of occurrence of the external cause: Secondary | ICD-10-CM | POA: Insufficient documentation

## 2020-08-29 DIAGNOSIS — S60221A Contusion of right hand, initial encounter: Secondary | ICD-10-CM

## 2020-08-29 DIAGNOSIS — Y990 Civilian activity done for income or pay: Secondary | ICD-10-CM | POA: Insufficient documentation

## 2020-08-29 DIAGNOSIS — F17210 Nicotine dependence, cigarettes, uncomplicated: Secondary | ICD-10-CM | POA: Insufficient documentation

## 2020-08-29 MED ORDER — METHOCARBAMOL 500 MG PO TABS: 500.0000 mg | ORAL_TABLET | Freq: Four times a day (QID) | ORAL | 0 refills | Status: AC

## 2020-08-29 MED ORDER — MELOXICAM 15 MG PO TABS: 15.0000 mg | ORAL_TABLET | Freq: Every day | ORAL | 0 refills | Status: AC

## 2020-08-29 MED ORDER — TETANUS-DIPHTH-ACELL PERTUSSIS 5-2.5-18.5 LF-MCG/0.5 IM SUSY
0.5000 mL | PREFILLED_SYRINGE | Freq: Once | INTRAMUSCULAR | Status: AC
  Administered 2020-08-29: 0.5 mL via INTRAMUSCULAR
  Filled 2020-08-29: qty 0.5

## 2020-08-29 NOTE — ED Triage Notes (Addendum)
Pt to ER via POV with complaints of R shoulder injury. Pt reports changing a tire at work, the Economist blew apart and it pulled his arm into it. Reports when it pulled it hurt his R shoulder and the right side of his back. Pt also small cut present to right fifth finger. Unsure of last tetanus shot.

## 2020-08-29 NOTE — ED Notes (Signed)
See triage note. Injury to R shoulder from tire changing machine.   Pt has R hand and R back pain, 6/10.  Provider at bedside.

## 2020-08-29 NOTE — ED Provider Notes (Signed)
[facility] Emergency Department Provider Note  ____________________________________________  Time seen: Approximately 6:42 PM  I have reviewed the triage vital signs and the nursing notes.   HISTORY  Chief Complaint Shoulder Injury    HPI Rodney Morgan. is a 25 y.o. male who presents the emergency department complaining of right hand and right mid back pain after an accident at work.  Patient was using a tire changer to remove the tire from a rim.  The machine broke causing him to jolt forward.  Patient states that he made contact with a broken machine with his right hand sustaining a superficial laceration to the pinky finger.  Patient is complaining of hand pain at the level of the metacarpals as well.  Patient is also complaining pain around the latissimus dorsi muscle group of the right back.  He did not hit his back.  Patient denies any head or neck injury or pain.  No substernal chest pain, shortness of breath.  No lower back injury or pain.  No medications prior to arrival.  Unsure of last tetanus shot.          History reviewed. No pertinent past medical history.  Patient Active Problem List   Diagnosis Date Noted  . Metacarpal bone fracture 01/23/2014    Past Surgical History:  Procedure Laterality Date  . OPEN REDUCTION INTERNAL FIXATION (ORIF) METACARPAL Right 01/25/2014   Procedure: OPEN REDUCTION INTERNAL FIXATION (ORIF) FIFTH METACARPAL;  Surgeon: Vickki Hearing, MD;  Location: [facility];  Service: Orthopedics;  Laterality: Right;  right fifth metacarpal    Prior to Admission medications   Medication Sig Start Date End Date Taking? Authorizing Provider  meloxicam (MOBIC) 15 MG tablet Take 1 tablet (15 mg total) by mouth daily. 08/29/20  Yes Tomeshia Pizzi, Delorise Royals, PA-C  methocarbamol (ROBAXIN) 500 MG tablet Take 1 tablet (500 mg total) by mouth 4 (four) times daily. 08/29/20  Yes Rohil Lesch, Delorise Royals, PA-C  mupirocin ointment (BACTROBAN)  2 % Apply to affected area 3 times daily 07/09/20   Menshew, Charlesetta Ivory, PA-C    Allergies Patient has no known allergies.  No family history on file.  Social History Social History   Tobacco Use  . Smoking status: Current Every Day Smoker    Packs/day: 1.00    Types: Cigarettes  . Smokeless tobacco: Never Used  Vaping Use  . Vaping Use: Never used  Substance Use Topics  . Alcohol use: Yes  . Drug use: Not Currently    Comment: last use x1 month ago.     Review of Systems  Constitutional: No fever/chills Eyes: No visual changes. No discharge ENT: No upper respiratory complaints. Cardiovascular: no chest pain. Respiratory: no cough. No SOB. Gastrointestinal: No abdominal pain.  No nausea, no vomiting.  No diarrhea.  No constipation. Musculoskeletal: Pain and injury to the right hand with superficial laceration to the pinky finger of the right hand.  Patient is also complaining of pain to the right latissimus dorsi region Skin: Negative for rash, abrasions, lacerations, ecchymosis. Neurological: Negative for headaches, focal weakness or numbness.  10 System ROS otherwise negative.  ____________________________________________   PHYSICAL EXAM:  VITAL SIGNS: ED Triage Vitals  Enc Vitals Group     BP 08/29/20 1756 127/81     Pulse Rate 08/29/20 1756 83     Resp 08/29/20 1756 16     Temp 08/29/20 1756 99 F (37.2 C)     Temp Source 08/29/20 1756 Oral  SpO2 08/29/20 1756 99 %     Weight 08/29/20 1758 140 lb (63.5 kg)     Height 08/29/20 1758 5\' 8"  (1.727 m)     Head Circumference --      Peak Flow --      Pain Score 08/29/20 1757 6     Pain Loc --      Pain Edu? --      Excl. in GC? --      Constitutional: Alert and oriented. Well appearing and in no acute distress. Eyes: Conjunctivae are normal. PERRL. EOMI. Head: Atraumatic. ENT:      Ears:       Nose: No congestion/rhinnorhea.      Mouth/Throat: Mucous membranes are moist.  Neck: No stridor.  No  cervical spine tenderness to palpation.  Cardiovascular: Normal rate, regular rhythm. Normal S1 and S2.  Good peripheral circulation. Respiratory: Normal respiratory effort without tachypnea or retractions. Lungs CTAB. Good air entry to the bases with no decreased or absent breath sounds. Gastrointestinal: Bowel sounds 4 quadrants. Soft and nontender to palpation. No guarding or rigidity. No palpable masses. No distention. No CVA tenderness Musculoskeletal: Full range of motion to all extremities. No gross deformities appreciated.  Visualization of the right hand reveals a superficial avulsion type injury to the medial aspect of the pinky finger right hand.  No active bleeding.  No visible foreign body.  Good range of motion to the digit.  Patient has some mild edema about the fifth metacarpal range.  No open wounds in this area.  Patient is tender.  No significant palpable abnormality/deformity.  Sensation and capillary refill intact all digits.  Examination of the posterior back reveals no visible signs of trauma.  Patient is nontender midline over the spine.  Patient has tenderness along the latissimus dorsi muscle group without palpable abnormality.  No tenderness over the ribs extending around the chest wall.  Good underlying breath sounds bilaterally. Neurologic:  Normal speech and language. No gross focal neurologic deficits are appreciated.  Skin:  Skin is warm, dry and intact. No rash noted. Psychiatric: Mood and affect are normal. Speech and behavior are normal. Patient exhibits appropriate insight and judgement.   ____________________________________________   LABS (all labs ordered are listed, but only abnormal results are displayed)  Labs Reviewed - No data to display ____________________________________________  EKG   ____________________________________________  RADIOLOGY I personally viewed and evaluated these images as part of my medical decision making, as well as reviewing  the written report by the radiologist.  ED Provider Interpretation: Evaluation of patient's imaging of the hand and ribs reveals no acute traumatic findings.  Specifically no fractures or dislocation.  No retained foreign bodies in the right hand  DG Ribs Unilateral W/Chest Right  Result Date: 08/29/2020 CLINICAL DATA:  Right shoulder and rib pain following blunt trauma, initial encounter EXAM: RIGHT RIBS AND CHEST - 3+ VIEW COMPARISON:  06/06/2019 FINDINGS: Cardiac shadow is within normal limits. The lungs are well aerated bilaterally. No focal infiltrate, effusion or pneumothorax is seen. No rib fractures are seen. IMPRESSION: No acute abnormality noted. Electronically Signed   By: 08/04/2019 M.D.   On: 08/29/2020 19:30   DG Hand Complete Right  Result Date: 08/29/2020 CLINICAL DATA:  Blunt trauma to the right hand with pain, initial encounter EXAM: RIGHT HAND - COMPLETE 3+ VIEW COMPARISON:  02/06/2020 FINDINGS: Postsurgical changes are again seen in the distal aspect of the fifth metacarpal. Mild soft tissue injury in the fifth digit  is seen. No radiopaque foreign body is noted. No acute fracture or dislocation is seen. IMPRESSION: Soft tissue injury without acute bony abnormality. Electronically Signed   By: Alcide Clever M.D.   On: 08/29/2020 19:29    ____________________________________________    PROCEDURES  Procedure(s) performed:    Procedures    Medications  Tdap (BOOSTRIX) injection 0.5 mL (0.5 mLs Intramuscular Given 08/29/20 1920)     ____________________________________________   INITIAL IMPRESSION / ASSESSMENT AND PLAN / ED COURSE  Pertinent labs & imaging results that were available during my care of the patient were reviewed by me and considered in my medical decision making (see chart for details).  Review of the [place] CSRS was performed in accordance of the [facility] prior to dispensing any controlled drugs.           Patient's diagnosis is consistent with  pulled back muscle, right hand contusion, laceration of of the pinky finger.  Patient presented to emergency department after a machine he was using at work broke.  Patient injured his right hand with an avulsion type laceration that was unsuitable for closure here in the emergency department.  Area was thoroughly cleansed and dressed.  Thankfully this is very shallow and should heal fine without complication.  Wound care instructions discussed with patient.  There is no underlying fractures to the ribs or right hand.  Given the distribution of pain without direct trauma to the ribs I feel that this is likely a latissimus dorsi muscle strain.  Patient will have anti-inflammatory muscle relaxer for symptom relief.  Follow-up primary care as needed.. Patient is given ED precautions to return to the ED for any worsening or new symptoms.     ____________________________________________  FINAL CLINICAL IMPRESSION(S) / ED DIAGNOSES  Final diagnoses:  Rib pain  Laceration of right little finger without foreign body without damage to nail, initial encounter  Contusion of right hand, initial encounter  Muscle strain of right upper back, initial encounter      NEW MEDICATIONS STARTED DURING THIS VISIT:  ED Discharge Orders         Ordered    meloxicam (MOBIC) 15 MG tablet  Daily        08/29/20 2038    methocarbamol (ROBAXIN) 500 MG tablet  4 times daily        08/29/20 2038              This chart was dictated using voice recognition software/Dragon. Despite best efforts to proofread, errors can occur which can change the meaning. Any change was purely unintentional.    Racheal Patches, PA-C 08/29/20 2038    Gilles Chiquito, MD 08/30/20 [phone]

## 2020-10-14 ENCOUNTER — Other Ambulatory Visit: Payer: Self-pay

## 2020-10-14 ENCOUNTER — Emergency Department: Payer: Self-pay

## 2020-10-14 ENCOUNTER — Emergency Department
Admission: EM | Admit: 2020-10-14 | Discharge: 2020-10-14 | Disposition: A | Discharge status: Home / Self Care | Payer: Self-pay | Attending: Emergency Medicine | Admitting: Emergency Medicine

## 2020-10-14 DIAGNOSIS — F17210 Nicotine dependence, cigarettes, uncomplicated: Secondary | ICD-10-CM | POA: Insufficient documentation

## 2020-10-14 DIAGNOSIS — M6282 Rhabdomyolysis: Secondary | ICD-10-CM

## 2020-10-14 DIAGNOSIS — R1031 Right lower quadrant pain: Secondary | ICD-10-CM | POA: Insufficient documentation

## 2020-10-14 LAB — URINALYSIS, COMPLETE (UACMP) WITH MICROSCOPIC
Bacteria, UA: NONE SEEN
Bilirubin Urine: NEGATIVE
Glucose, UA: NEGATIVE mg/dL
Hgb urine dipstick: NEGATIVE
Ketones, ur: NEGATIVE mg/dL
Leukocytes,Ua: NEGATIVE
Nitrite: NEGATIVE
Protein, ur: NEGATIVE mg/dL
Specific Gravity, Urine: 1.026 (ref 1.005–1.030)
pH: 5 (ref 5.0–8.0)

## 2020-10-14 LAB — CBC
HCT: 41 % (ref 39.0–52.0)
Hemoglobin: 13.7 g/dL (ref 13.0–17.0)
MCH: 30.6 pg (ref 26.0–34.0)
MCHC: 33.4 g/dL (ref 30.0–36.0)
MCV: 91.5 fL (ref 80.0–100.0)
Platelets: 223 10*3/uL (ref 150–400)
RBC: 4.48 MIL/uL (ref 4.22–5.81)
RDW: 13 % (ref 11.5–15.5)
WBC: 10.8 10*3/uL — ABNORMAL HIGH (ref 4.0–10.5)
nRBC: 0 % (ref 0.0–0.2)

## 2020-10-14 LAB — COMPREHENSIVE METABOLIC PANEL
ALT: 83 U/L — ABNORMAL HIGH (ref 0–44)
AST: 75 U/L — ABNORMAL HIGH (ref 15–41)
Albumin: 4.8 g/dL (ref 3.5–5.0)
Alkaline Phosphatase: 50 U/L (ref 38–126)
Anion gap: 7 (ref 5–15)
BUN: 15 mg/dL (ref 6–20)
CO2: 25 mmol/L (ref 22–32)
Calcium: 9.4 mg/dL (ref 8.9–10.3)
Chloride: 106 mmol/L (ref 98–111)
Creatinine, Ser: 0.79 mg/dL (ref 0.61–1.24)
GFR, Estimated: >60 mL/min (ref >60)
Glucose, Bld: 97 mg/dL (ref 70–99)
Potassium: 3.8 mmol/L (ref 3.5–5.1)
Sodium: 138 mmol/L (ref 135–145)
Total Bilirubin: 0.6 mg/dL (ref 0.3–1.2)
Total Protein: 7.5 g/dL (ref 6.5–8.1)

## 2020-10-14 LAB — LIPASE, BLOOD: Lipase: 30 U/L (ref 11–51)

## 2020-10-14 LAB — CK: Total CK: 1394 U/L — ABNORMAL HIGH (ref 49–397)

## 2020-10-14 MED ORDER — SODIUM CHLORIDE 0.9 % IV BOLUS: 1000.0000 mL | Freq: Once | INTRAVENOUS | Status: DC

## 2020-10-14 MED ORDER — SODIUM CHLORIDE 0.9 % IV BOLUS
1000.0000 mL | Freq: Once | INTRAVENOUS | Status: AC
  Administered 2020-10-14: 1000 mL via INTRAVENOUS

## 2020-10-14 NOTE — ED Triage Notes (Signed)
Pt via POV from home. Pt c/o LLQ abd pain that started today. Denies NVD. Denies urinary symptoms. Denies abd surgeries. Denies fever. Pt is A&Ox4 and NAD

## 2020-10-14 NOTE — ED Triage Notes (Signed)
First Nurse Note:  C/O RLQ abdominal pain today.  AAOx3.  Skin warm and dry.  Posture upright and relaxed. NAD

## 2020-10-14 NOTE — ED Provider Notes (Signed)
[facility] Emergency Department Provider Note  ____________________________________________   Event Date/Time   First MD Initiated Contact with Patient 10/14/20 1750     (approximate)  I have reviewed the triage vital signs and the nursing notes.   HISTORY  Chief Complaint Abdominal Pain  HPI Rodney Muench. is a 25 y.o. male who presents to the emergency department today for evaluation of right lower abdominal pain began late last night after 1 episode of emesis.  He reports that the right lower quadrant abdominal pain has continued to worsen throughout the course of the day.  He denies any fever, dysuria, chest pain or shortness of breath, further episodes of emesis or diarrhea.  Of note, he does report that he works a Youth worker job as a Presenter, broadcasting and also began working out 2 weeks ago and has been working out 6 days/week.         No past medical history on file.  Patient Active Problem List   Diagnosis Date Noted  . Metacarpal bone fracture 01/23/2014    Past Surgical History:  Procedure Laterality Date  . OPEN REDUCTION INTERNAL FIXATION (ORIF) METACARPAL Right 01/25/2014   Procedure: OPEN REDUCTION INTERNAL FIXATION (ORIF) FIFTH METACARPAL;  Surgeon: Vickki Hearing, MD;  Location: [facility];  Service: Orthopedics;  Laterality: Right;  right fifth metacarpal    Prior to Admission medications   Medication Sig Start Date End Date Taking? Authorizing Provider  meloxicam (MOBIC) 15 MG tablet Take 1 tablet (15 mg total) by mouth daily. 08/29/20   Cuthriell, Delorise Royals, PA-C  methocarbamol (ROBAXIN) 500 MG tablet Take 1 tablet (500 mg total) by mouth 4 (four) times daily. 08/29/20   Cuthriell, Delorise Royals, PA-C  mupirocin ointment (BACTROBAN) 2 % Apply to affected area 3 times daily 07/09/20   Menshew, Charlesetta Ivory, PA-C    Allergies Patient has no known allergies.  No family history on file.  Social History Social History    Tobacco Use  . Smoking status: Current Every Day Smoker    Packs/day: 1.00    Types: Cigarettes  . Smokeless tobacco: Never Used  Vaping Use  . Vaping Use: Never used  Substance Use Topics  . Alcohol use: Yes  . Drug use: Not Currently    Comment: last use x1 month ago.    Review of Systems Constitutional: No fever/chills Eyes: No visual changes. ENT: No sore throat. Cardiovascular: Denies chest pain. Respiratory: Denies shortness of breath. Gastrointestinal: + abdominal pain.  No nausea, + vomiting.  No diarrhea.  No constipation. Genitourinary: Negative for dysuria. Musculoskeletal: Negative for back pain. Skin: Negative for rash. Neurological: Negative for headaches, focal weakness or numbness.   ____________________________________________   PHYSICAL EXAM:  VITAL SIGNS: ED Triage Vitals  Enc Vitals Group     BP 10/14/20 1741 133/77     Pulse Rate 10/14/20 1739 79     Resp 10/14/20 1739 20     Temp 10/14/20 1739 98.2 F (36.8 C)     Temp Source 10/14/20 1739 Oral     SpO2 10/14/20 1739 100 %     Weight 10/14/20 1739 145 lb (65.8 kg)     Height 10/14/20 1739 5\' 8"  (1.727 m)     Head Circumference --      Peak Flow --      Pain Score 10/14/20 1739 10     Pain Loc --      Pain Edu? --  Excl. in GC? --    Constitutional: Alert and oriented. Well appearing and in no acute distress. Eyes: Conjunctivae are normal. PERRL. EOMI. Head: Atraumatic. Nose: No congestion/rhinnorhea. Mouth/Throat: Mucous membranes are moist.  Oropharynx non-erythematous. Neck: No stridor.   Cardiovascular: Normal rate, regular rhythm. Grossly normal heart sounds.  Good peripheral circulation. Respiratory: Normal respiratory effort.  No retractions. Lungs CTAB. Gastrointestinal: Mildly tender to palpate in the right lower quadrant, no guarding present. No distention. No abdominal bruits. No CVA tenderness. Musculoskeletal: No lower extremity tenderness nor edema.  No joint  effusions. Neurologic:  Normal speech and language. No gross focal neurologic deficits are appreciated. No gait instability. Skin:  Skin is warm, dry and intact. No rash noted. Psychiatric: Mood and affect are normal. Speech and behavior are normal.  ____________________________________________   LABS (all labs ordered are listed, but only abnormal results are displayed)  Labs Reviewed  COMPREHENSIVE METABOLIC PANEL - Abnormal; Notable for the following components:      Result Value   AST 75 (*)    ALT 83 (*)    All other components within normal limits  CBC - Abnormal; Notable for the following components:   WBC 10.8 (*)    All other components within normal limits  URINALYSIS, COMPLETE (UACMP) WITH MICROSCOPIC - Abnormal; Notable for the following components:   Color, Urine YELLOW (*)    APPearance CLEAR (*)    All other components within normal limits  LIPASE, BLOOD  CK    ____________________________________________  RADIOLOGY  Official radiology report(s): CT ABDOMEN PELVIS WO CONTRAST  Result Date: 10/14/2020 CLINICAL DATA:  Right lower quadrant pain.  Rule out appendicitis. EXAM: CT ABDOMEN AND PELVIS WITHOUT CONTRAST TECHNIQUE: Multidetector CT imaging of the abdomen and pelvis was performed following the standard protocol without IV contrast. COMPARISON:  05/28/2018 FINDINGS: Lower chest: No acute abnormality. Hepatobiliary: No focal liver abnormality is seen. No gallstones, gallbladder wall thickening, or biliary dilatation. Pancreas: Unremarkable. No pancreatic ductal dilatation or surrounding inflammatory changes. Spleen: Normal in size without focal abnormality. Adrenals/Urinary Tract: Adrenal glands are unremarkable. Kidneys are normal, without renal calculi, focal lesion, or hydronephrosis. Bladder is unremarkable. Stomach/Bowel: Stomach is within normal limits. The appendix is not visualized in it's entirety separate from the right lower quadrant bowel loops.  However, the visualized portions of the appendix appear normal however, image 34/5. There are no secondary signs of acute appendicitis. No evidence of bowel wall thickening, distention, or inflammatory changes. Vascular/Lymphatic: No significant vascular findings are present. No enlarged abdominal or pelvic lymph nodes. Reproductive: Prostate is unremarkable. Other: No free fluid or fluid collections. No ventral abdominal wall hernia noted. Musculoskeletal: No acute or significant osseous findings. IMPRESSION: 1. No acute findings identified within the abdomen or pelvis. 2. The appendix is not visualized in it's entirety separate from the right lower quadrant bowel loops. However, the visualized portions of the appendix appear normal. No secondary signs of acute appendicitis identified. Electronically Signed   By: Signa Kell M.D.   On: 10/14/2020 19:11    ____________________________________________   INITIAL IMPRESSION / ASSESSMENT AND PLAN / ED COURSE  As part of my medical decision making, I reviewed the following data within the electronic MEDICAL RECORD NUMBER Nursing notes reviewed and incorporated, Labs reviewed, Evaluated by EM attending Dr. Fuller Plan and Notes from prior ED visits        Patient is a 25 year old male who presents to the emergency department for evaluation of right lower quadrant abdominal pain that began late  last night with one episode of emesis with none since then.  He denies any fevers, continued emesis or any other complaints.  See HPI for further details.  In triage, patient has normal vital signs.  On physical exam he has mild tenderness to the right lower quadrant with no guarding or other peritoneal signs present.  Labs were obtained which demonstrate a very mild leukocytosis of 10.8.  He does appear on CMP that he has a mild elevation in his AST and ALT, urinalysis and lipase are within normal limits.  CT of the abdomen without contrast was obtained and demonstrates no  evidence of any acute appendicitis at this time.  Given the patient's mild bump in liver enzymes as well as his recent workout regimen and manual labor, will add on a CK to ensure that he does not have any rhabdomyolysis as the cause of his symptoms.  At this time, the patient is being signed out to attending Dr. Fuller Plan, who will follow this lab and dispo the patient at that time.  Suspect that he likely will go home given his reassuring labs and CT up into this point.  Patient is stable at this time and signed out.      ____________________________________________   FINAL CLINICAL IMPRESSION(S) / ED DIAGNOSES  Final diagnoses:  None     ED Discharge Orders    None      *Please note:  Rodney Moan. was evaluated in Emergency Department on 10/14/2020 for the symptoms described in the history of present illness. He was evaluated in the context of the global COVID-19 pandemic, which necessitated consideration that the patient might be at risk for infection with the SARS-CoV-2 virus that causes COVID-19. Institutional protocols and algorithms that pertain to the evaluation of patients at risk for COVID-19 are in a state of rapid change based on information released by regulatory bodies including the CDC and federal and state organizations. These policies and algorithms were followed during the patient's care in the ED.  Some ED evaluations and interventions may be delayed as a result of limited staffing during and the pandemic.*   Note:  This document was prepared using Dragon voice recognition software and may include unintentional dictation errors.   Lucy Chris, PA 10/14/20 2014    Concha Se, MD 10/14/20 [phone]

## 2020-10-14 NOTE — ED Notes (Signed)
See triage note, pt reports lower abd pain that started last night with emesis x1. NAD noted.

## 2020-10-14 NOTE — Discharge Instructions (Signed)
Your CT scan did not show appendicitis but it was not able to see your appendix very well.  However I have low suspicion for it but if you develop fevers, worsening pain in that area, nausea, vomiting you need to return to the ER for repeat imaging.  Your CK was elevated which is a sign of rhabdomyolysis.  Should have this rechecked in 2 days.  Drink plenty of fluids and avoid intensive labor and intensive exercise.  Return to the ER for recheck if your symptoms are worsening prior to that

## 2020-10-18 ENCOUNTER — Other Ambulatory Visit: Payer: Self-pay

## 2020-10-18 ENCOUNTER — Emergency Department
Admission: EM | Admit: 2020-10-18 | Discharge: 2020-10-18 | Disposition: A | Discharge status: Home / Self Care | Payer: Self-pay | Attending: Emergency Medicine | Admitting: Emergency Medicine

## 2020-10-18 DIAGNOSIS — F17210 Nicotine dependence, cigarettes, uncomplicated: Secondary | ICD-10-CM | POA: Insufficient documentation

## 2020-10-18 DIAGNOSIS — Z01812 Encounter for preprocedural laboratory examination: Secondary | ICD-10-CM | POA: Insufficient documentation

## 2020-10-18 DIAGNOSIS — R109 Unspecified abdominal pain: Secondary | ICD-10-CM | POA: Insufficient documentation

## 2020-10-18 LAB — CBC
HCT: 45.3 % (ref 39.0–52.0)
Hemoglobin: 15.2 g/dL (ref 13.0–17.0)
MCH: 31.4 pg (ref 26.0–34.0)
MCHC: 33.6 g/dL (ref 30.0–36.0)
MCV: 93.6 fL (ref 80.0–100.0)
Platelets: 224 10*3/uL (ref 150–400)
RBC: 4.84 MIL/uL (ref 4.22–5.81)
RDW: 13.3 % (ref 11.5–15.5)
WBC: 10.3 10*3/uL (ref 4.0–10.5)
nRBC: 0 % (ref 0.0–0.2)

## 2020-10-18 LAB — URINALYSIS, COMPLETE (UACMP) WITH MICROSCOPIC
Bacteria, UA: NONE SEEN
Bilirubin Urine: NEGATIVE
Glucose, UA: NEGATIVE mg/dL
Hgb urine dipstick: NEGATIVE
Ketones, ur: NEGATIVE mg/dL
Leukocytes,Ua: NEGATIVE
Nitrite: NEGATIVE
Protein, ur: NEGATIVE mg/dL
Specific Gravity, Urine: 1.021 (ref 1.005–1.030)
Squamous Epithelial / HPF: NONE SEEN (ref 0–5)
pH: 5 (ref 5.0–8.0)

## 2020-10-18 LAB — COMPREHENSIVE METABOLIC PANEL
ALT: 46 U/L — ABNORMAL HIGH (ref 0–44)
AST: 31 U/L (ref 15–41)
Albumin: 4.5 g/dL (ref 3.5–5.0)
Alkaline Phosphatase: 55 U/L (ref 38–126)
Anion gap: 7 (ref 5–15)
BUN: 17 mg/dL (ref 6–20)
CO2: 25 mmol/L (ref 22–32)
Calcium: 9.1 mg/dL (ref 8.9–10.3)
Chloride: 109 mmol/L (ref 98–111)
Creatinine, Ser: 1.04 mg/dL (ref 0.61–1.24)
GFR, Estimated: >60 mL/min (ref >60)
Glucose, Bld: 91 mg/dL (ref 70–99)
Potassium: 3.6 mmol/L (ref 3.5–5.1)
Sodium: 141 mmol/L (ref 135–145)
Total Bilirubin: 0.6 mg/dL (ref 0.3–1.2)
Total Protein: 7.3 g/dL (ref 6.5–8.1)

## 2020-10-18 LAB — CK: Total CK: 258 U/L (ref 49–397)

## 2020-10-18 LAB — LIPASE, BLOOD: Lipase: 35 U/L (ref 11–51)

## 2020-10-18 MED ORDER — SODIUM CHLORIDE 0.9 % IV BOLUS
1000.0000 mL | Freq: Once | INTRAVENOUS | Status: AC
  Administered 2020-10-18: 1000 mL via INTRAVENOUS

## 2020-10-18 NOTE — ED Notes (Signed)
IV catheter removed intact without complication.  D/C instructions given.  All questions addressed.  Understanding verbalized.  Pt left ER ambulatory with steady gait.

## 2020-10-18 NOTE — ED Provider Notes (Addendum)
[facility] Emergency Department Provider Note  Time seen: 6:34 AM  I have reviewed the triage vital signs and the nursing notes.   HISTORY  Chief Complaint Abdominal Pain   HPI Rodney Morgan. is a 25 y.o. male with no significant past medical history presents to the emergency department for lab check and abdominal discomfort.  According to the patient he was seen here approximate 4 days ago when he was having right-sided abdominal pain.  He states at that time he was noted to have a high CK level, supposed to come back the following day to have it rechecked but states he did not.  States he has continued to have some very mild right-sided abdominal burning but denies any "pain."  Patient denies any nausea vomiting or diarrhea.  States he had worked out excessively before the last evaluation but stopped the workout regimen due to fatigue.  Patient states clear urine.  Much negative review of systems.   No past medical history on file.  Patient Active Problem List   Diagnosis Date Noted  . Metacarpal bone fracture 01/23/2014    Past Surgical History:  Procedure Laterality Date  . OPEN REDUCTION INTERNAL FIXATION (ORIF) METACARPAL Right 01/25/2014   Procedure: OPEN REDUCTION INTERNAL FIXATION (ORIF) FIFTH METACARPAL;  Surgeon: Vickki Hearing, MD;  Location: [facility];  Service: Orthopedics;  Laterality: Right;  right fifth metacarpal    Prior to Admission medications   Medication Sig Start Date End Date Taking? Authorizing Provider  meloxicam (MOBIC) 15 MG tablet Take 1 tablet (15 mg total) by mouth daily. 08/29/20   Cuthriell, Delorise Royals, PA-C  methocarbamol (ROBAXIN) 500 MG tablet Take 1 tablet (500 mg total) by mouth 4 (four) times daily. 08/29/20   Cuthriell, Delorise Royals, PA-C  mupirocin ointment (BACTROBAN) 2 % Apply to affected area 3 times daily 07/09/20   Menshew, Charlesetta Ivory, PA-C    No Known Allergies  No family history on file.  Social  History Social History   Tobacco Use  . Smoking status: Current Every Day Smoker    Packs/day: 1.00    Types: Cigarettes  . Smokeless tobacco: Never Used  Vaping Use  . Vaping Use: Never used  Substance Use Topics  . Alcohol use: Yes  . Drug use: Not Currently    Comment: last use x1 month ago.    Review of Systems Constitutional: Negative for fever. Cardiovascular: Negative for chest pain. Respiratory: Negative for shortness of breath. Gastrointestinal: Mild abdominal discomfort.  Negative for nausea vomiting or diarrhea Genitourinary: Negative for urinary compaints Musculoskeletal: Negative for musculoskeletal complaints Neurological: Negative for headache All other ROS negative  ____________________________________________   PHYSICAL EXAM:  VITAL SIGNS: ED Triage Vitals  Enc Vitals Group     BP 10/18/20 0623 130/72     Pulse Rate 10/18/20 0623 76     Resp 10/18/20 0623 20     Temp 10/18/20 0623 97.8 F (36.6 C)     Temp Source 10/18/20 0623 Oral     SpO2 10/18/20 0623 100 %     Weight 10/18/20 0624 150 lb (68 kg)     Height 10/18/20 0624 5\' 8"  (1.727 m)     Head Circumference --      Peak Flow --      Pain Score 10/18/20 0624 7     Pain Loc --      Pain Edu? --      Excl. in GC? --  Constitutional: Alert and oriented. Well appearing and in no distress. Eyes: Normal exam ENT      Head: Normocephalic and atraumatic.      Mouth/Throat: Mucous membranes are moist. Cardiovascular: Normal rate, regular rhythm.  Respiratory: Normal respiratory effort without tachypnea nor retractions. Breath sounds are clear Gastrointestinal: Soft and nontender. No distention.  There is no CVA tenderness. Musculoskeletal: Nontender with normal range of motion in all extremities.  Neurologic:  Normal speech and language. No gross focal neurologic deficits  Skin:  Skin is warm, dry.  No rash Psychiatric: Mood and affect are normal.    ____________________________________________   INITIAL IMPRESSION / ASSESSMENT AND PLAN / ED COURSE  Pertinent labs & imaging results that were available during my care of the patient were reviewed by me and considered in my medical decision making (see chart for details).   Patient presents emergency department for recheck of his CK level which was elevated on the 16th.  Continues to have some mild burning sensation to right side abdomen although completely benign on my exam, no tenderness, no rash.  We will recheck labs and IV hydrate.  If the labs are reassuring anticipate likely discharge home with PCP follow-up.   Patient's lab work is reassuring.  CK down to 258.  Remainder the lab work is nonrevealing.  We will discharge patient home with continued supportive care at home and PCP follow-up.  Patient agreeable to plan of care.  Discussed my typical abdominal pain return precautions.  Rodney Morgan. was evaluated in Emergency Department on 10/18/2020 for the symptoms described in the history of present illness. He was evaluated in the context of the global COVID-19 pandemic, which necessitated consideration that the patient might be at risk for infection with the SARS-CoV-2 virus that causes COVID-19. Institutional protocols and algorithms that pertain to the evaluation of patients at risk for COVID-19 are in a state of rapid change based on information released by regulatory bodies including the CDC and federal and state organizations. These policies and algorithms were followed during the patient's care in the ED.  ____________________________________________   FINAL CLINICAL IMPRESSION(S) / ED DIAGNOSES  Abdominal pain Lab check   Minna Antis, MD 10/18/20 5638    Minna Antis, MD 10/18/20 [phone]

## 2020-10-18 NOTE — ED Triage Notes (Signed)
Pt in with co right sided abd pain since Sunday. Was here Monday and told his CK level was high and refused to be admitted. Pt here for persistent and more constant pain.

## 2021-06-18 IMAGING — CR DG RIBS W/ CHEST 3+V*R*
5 series · 5 of 5 positions shown · non-contrast
Comparison: 06/06/2019

CLINICAL DATA: Right shoulder and rib pain following blunt trauma,
initial encounter

EXAM:
RIGHT RIBS AND CHEST - 3+ VIEW

[chest pa]
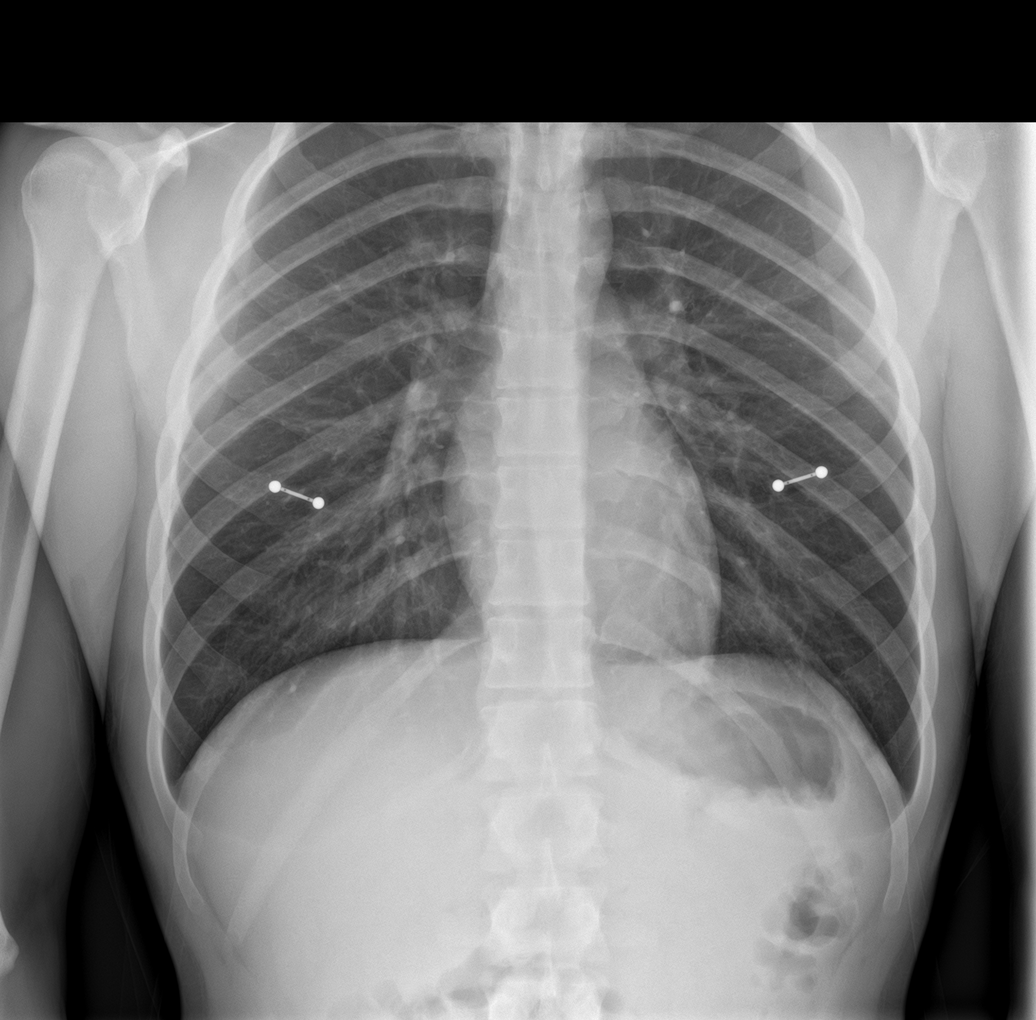

[rib pa]
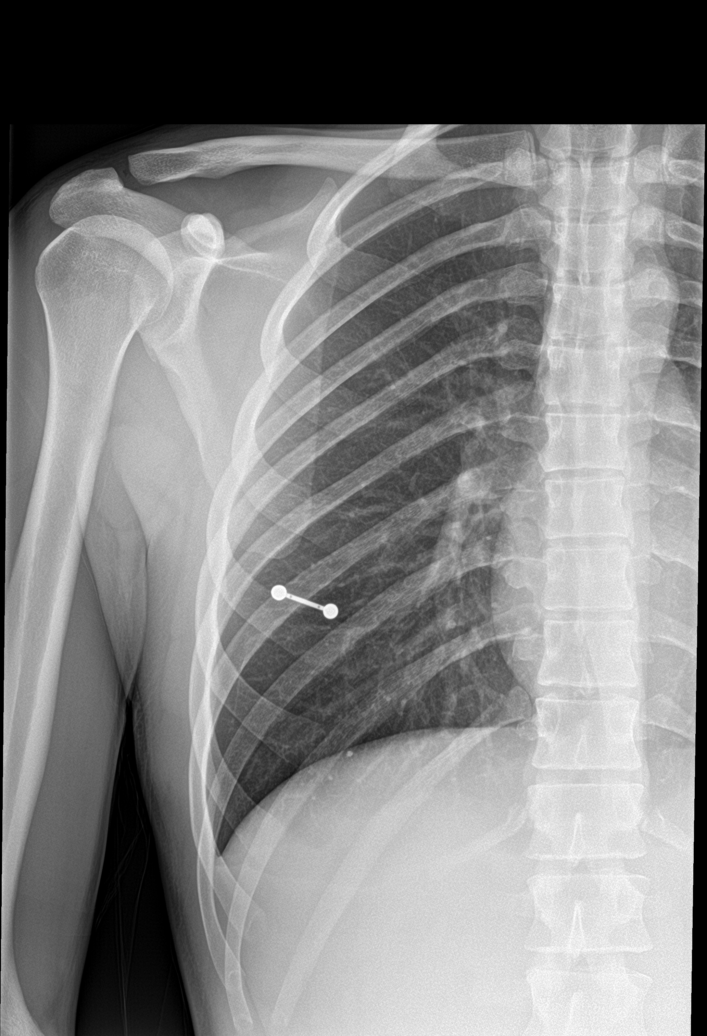

[rib pa obl]
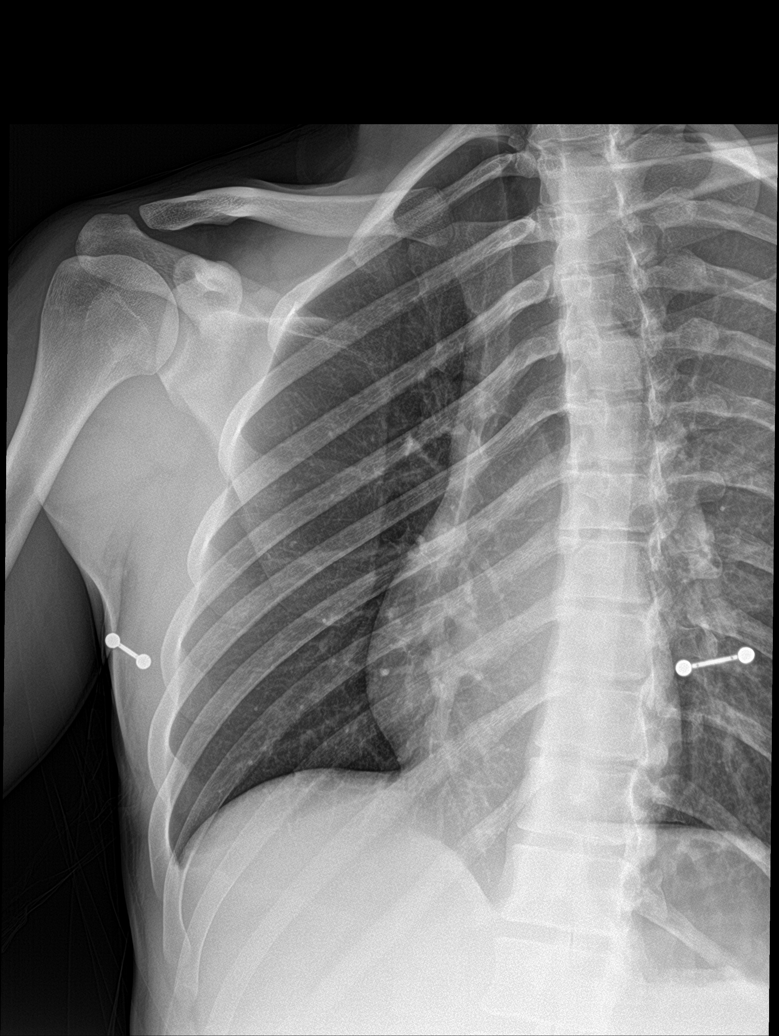

[rib ap]
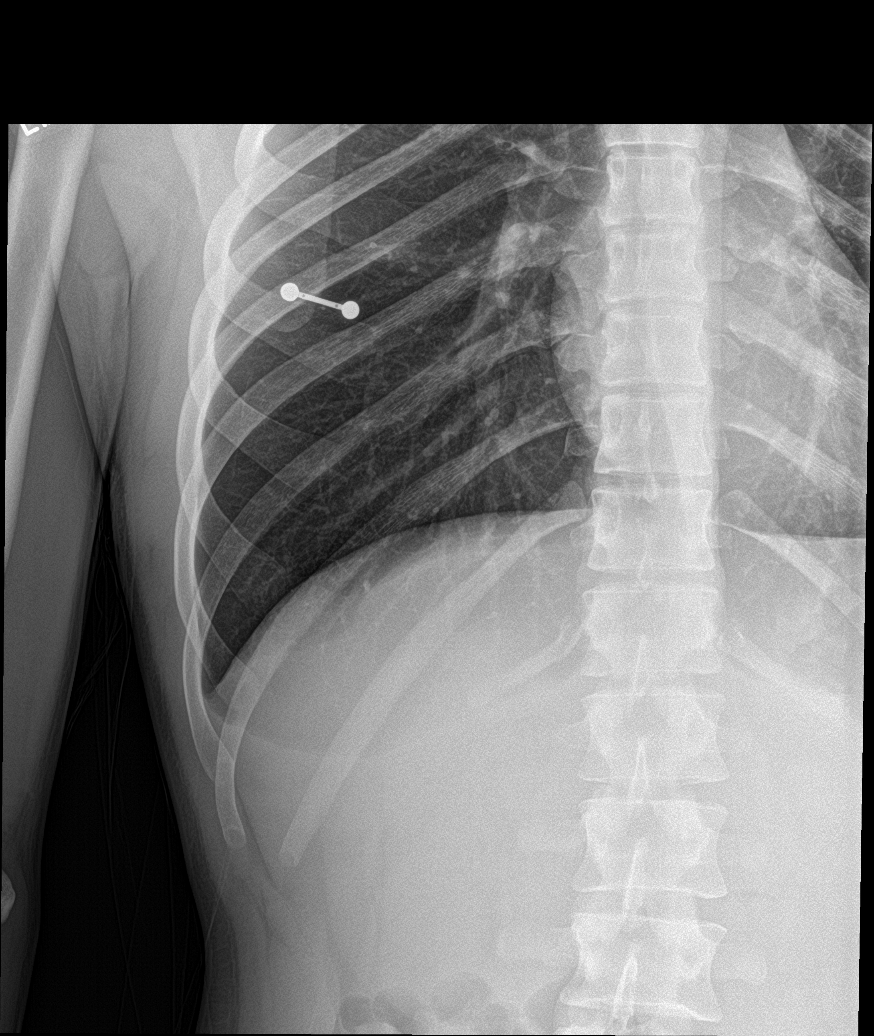

[rib ap obl]
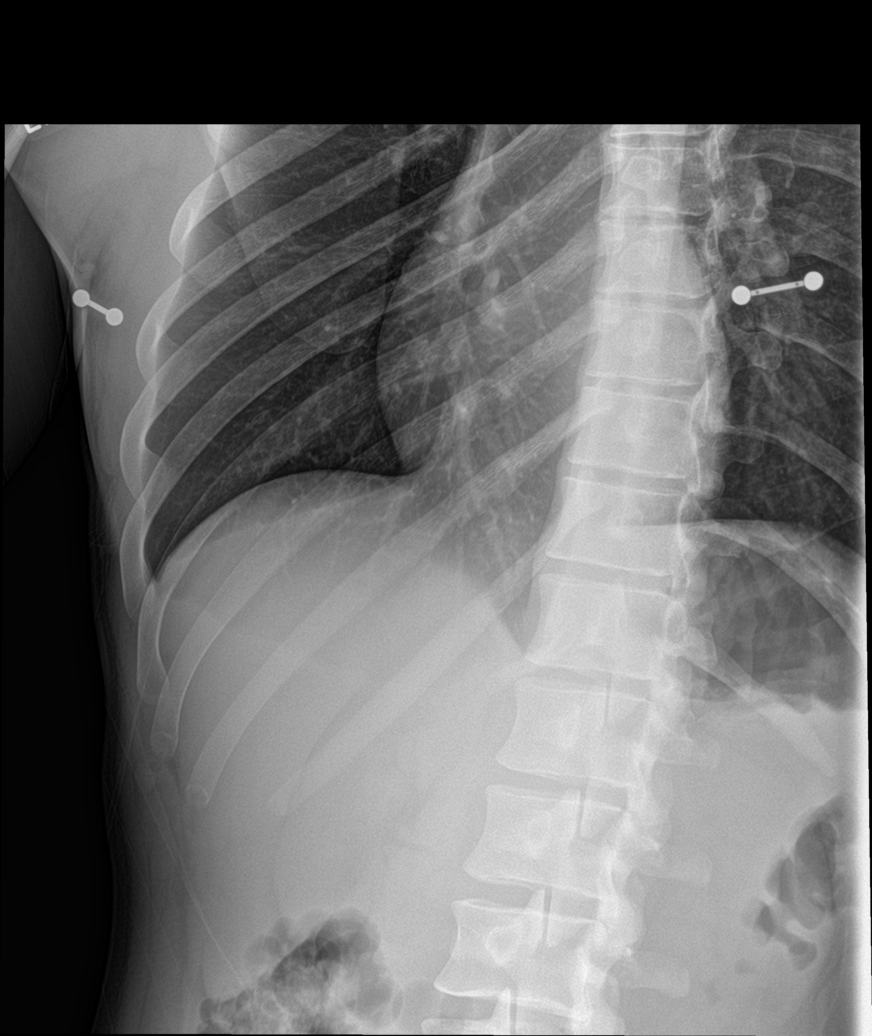

[5 of 5 positions shown; findings below may reference images not displayed]

FINDINGS: Cardiac shadow is within normal limits. The lungs are well aerated
bilaterally. No focal infiltrate, effusion or pneumothorax is seen.
No rib fractures are seen.
IMPRESSION: No acute abnormality noted.

## 2022-03-11 ENCOUNTER — Emergency Department
Admission: EM | Admit: 2022-03-11 | Discharge: 2022-03-11 | Disposition: A | Discharge status: Home / Self Care | Payer: Self-pay | Attending: Emergency Medicine | Admitting: Emergency Medicine

## 2022-03-11 ENCOUNTER — Encounter: Payer: Self-pay | Admitting: Emergency Medicine

## 2022-03-11 ENCOUNTER — Emergency Department: Payer: Self-pay

## 2022-03-11 DIAGNOSIS — F17210 Nicotine dependence, cigarettes, uncomplicated: Secondary | ICD-10-CM | POA: Insufficient documentation

## 2022-03-11 DIAGNOSIS — J069 Acute upper respiratory infection, unspecified: Secondary | ICD-10-CM | POA: Insufficient documentation

## 2022-03-11 MED ORDER — PSEUDOEPH-BROMPHEN-DM 30-2-10 MG/5ML PO SYRP: 5.0000 mL | ORAL_SOLUTION | Freq: Four times a day (QID) | ORAL | 0 refills | Status: AC | PRN

## 2022-03-11 MED ORDER — PREDNISONE 20 MG PO TABS: ORAL_TABLET | ORAL | 0 refills | Status: AC

## 2022-03-11 MED ORDER — ALBUTEROL SULFATE HFA 108 (90 BASE) MCG/ACT IN AERS: 2.0000 | INHALATION_SPRAY | Freq: Four times a day (QID) | RESPIRATORY_TRACT | 0 refills | Status: AC | PRN

## 2022-03-11 NOTE — ED Triage Notes (Addendum)
Pt presents via POV with complaints of dry cough over the last 3-4 days. Pt stated he has had a cough for the last week and has been taking mucinex which helped his sx initially but now he has a deep - dry cough. Denies CP or SOB.   Pt declined swab at this time.

## 2022-03-11 NOTE — ED Provider Notes (Signed)
[facility] Provider Note    Event Date/Time   First MD Initiated Contact with Patient 03/11/22 [phone]     (approximate)   History   Cough   HPI  Rodney Morgan. is a 26 y.o. male   presents to the ED with complaint of dry cough for the last 3 to 4 days without history of fever or chills.  Patient states he has been taking Mucinex with some minimal relief.  He states that coughing has kept him up at night and occasionally he coughs so long that he feels like he is short of breath.  Patient continues to smoke cigarettes daily.  He denies any chest pain, COVID symptoms or difficulty breathing at this time.      Physical Exam   Triage Vital Signs: ED Triage Vitals  Enc Vitals Group     BP 03/11/22 0632 121/77     Pulse Rate 03/11/22 0632 81     Resp 03/11/22 0632 16     Temp 03/11/22 0632 98.4 F (36.9 C)     Temp Source 03/11/22 0632 Oral     SpO2 03/11/22 0632 95 %     Weight 03/11/22 0632 145 lb (65.8 kg)     Height 03/11/22 0632 5\' 8"  (1.727 m)     Head Circumference --      Peak Flow --      Pain Score 03/11/22 0634 4     Pain Loc --      Pain Edu? --      Excl. in [place]? --     Most recent vital signs: Vitals:   03/11/22 0632  BP: 121/77  Pulse: 81  Resp: 16  Temp: 98.4 F (36.9 C)  SpO2: 95%     General: Awake, no distress.  No coughing noted at this time. CV:  Good peripheral perfusion.  Heart regular rate and rhythm. Resp:  Normal effort.  Lungs are clear bilaterally. Abd:  No distention.  Other:     ED Results / Procedures / Treatments   Labs (all labs ordered are listed, but only abnormal results are displayed) Labs Reviewed - No data to display    RADIOLOGY Chest x-ray images were reviewed by myself without infiltrate.  Official radiology report is negative for acute cardiopulmonary disease.    PROCEDURES:  Critical Care performed:   Procedures   MEDICATIONS ORDERED IN ED: Medications - No data to  display   IMPRESSION / MDM / [place] / ED COURSE  I reviewed the triage vital signs and the nursing notes.   Differential diagnosis includes, but is not limited to, COVID, viral URI, bronchitis.  26 year old male presents to the ED with complaint of 3 to 4 days of upper respiratory symptoms without relief taking Mucinex.  Physical exam is benign and chest x-ray was reassuring with no infiltrate suggesting pneumonia.  Patient was made aware.  We discussed discontinuing smoking and increasing fluids.  A prescription for albuterol inhaler, Bromfed-DM and prednisone was sent to the pharmacy.  He was also given a list of clinics to contact so that he can have a primary care provider in the future.  His return to the emergency department or follow-up with urgent care if any continued problems.     Patient's presentation is most consistent with acute complicated illness / injury requiring diagnostic workup.  FINAL CLINICAL IMPRESSION(S) / ED DIAGNOSES   Final diagnoses:  Viral URI with cough  Rx / DC Orders   ED Discharge Orders          Ordered    predniSONE (DELTASONE) 20 MG tablet        03/11/22 0745    brompheniramine-pseudoephedrine-DM 30-2-10 MG/5ML syrup  4 times daily PRN        03/11/22 0745    albuterol (VENTOLIN HFA) 108 (90 Base) MCG/ACT inhaler  Every 6 hours PRN        03/11/22 0745             Note:  This document was prepared using Dragon voice recognition software and may include unintentional dictation errors.   Johnn Hai, PA-C 03/11/22 0802    Vanessa [place], MD 03/11/22 [phone]

## 2022-03-11 NOTE — Discharge Instructions (Addendum)
Begin taking prednisone as directed for next 5 days, bromofed DM for cough and congestion and use the inhaler as needed for shortness of breath or wheezing.   Increase fluids Find a primary care doctor for the future by calling the clinics listed on your discharge papers

## 2023-04-19 ENCOUNTER — Ambulatory Visit: Payer: Self-pay
